# Patient Record
Sex: Male | Born: 1955 | Race: Asian | Hispanic: No | Marital: Married | Smoking: Current every day smoker
Health system: Southern US, Community
[De-identification: ages and names within clinical notes are randomized; demographics above are authoritative.]

## PROBLEM LIST (undated history)

## (undated) DIAGNOSIS — Z87448 Personal history of other diseases of urinary system: Secondary | ICD-10-CM

## (undated) DIAGNOSIS — E785 Hyperlipidemia, unspecified: Secondary | ICD-10-CM

## (undated) DIAGNOSIS — F17200 Nicotine dependence, unspecified, uncomplicated: Secondary | ICD-10-CM

## (undated) DIAGNOSIS — I2510 Atherosclerotic heart disease of native coronary artery without angina pectoris: Secondary | ICD-10-CM

## (undated) HISTORY — DX: Personal history of other diseases of urinary system: Z87.448

## (undated) HISTORY — DX: Hyperlipidemia, unspecified: E78.5

## (undated) HISTORY — DX: Nicotine dependence, unspecified, uncomplicated: F17.200

---
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
## 2005-04-10 ENCOUNTER — Ambulatory Visit: Payer: Self-pay | Admitting: Family Medicine

## 2005-06-05 ENCOUNTER — Ambulatory Visit: Payer: Self-pay | Admitting: Family Medicine

## 2005-06-12 ENCOUNTER — Ambulatory Visit: Payer: Self-pay | Admitting: Family Medicine

## 2008-04-11 ENCOUNTER — Ambulatory Visit: Payer: Self-pay | Admitting: Family Medicine

## 2008-04-11 DIAGNOSIS — J1189 Influenza due to unidentified influenza virus with other manifestations: Secondary | ICD-10-CM

## 2008-05-13 ENCOUNTER — Ambulatory Visit: Payer: Self-pay | Admitting: Family Medicine

## 2008-05-13 LAB — CONVERTED CEMR LAB
Nitrite: NEGATIVE
Protein, U semiquant: NEGATIVE
Urobilinogen, UA: 0.2
WBC Urine, dipstick: NEGATIVE

## 2008-05-18 LAB — CONVERTED CEMR LAB
ALT: 30 units/L (ref 0–53)
AST: 23 units/L (ref 0–37)
Basophils Absolute: 0.1 10*3/uL (ref 0.0–0.1)
Basophils Relative: 0.9 % (ref 0.0–3.0)
Bilirubin, Direct: 0.1 mg/dL (ref 0.0–0.3)
CO2: 32 meq/L (ref 19–32)
Chloride: 107 meq/L (ref 96–112)
Cholesterol: 200 mg/dL (ref 0–200)
Creatinine, Ser: 1.1 mg/dL (ref 0.4–1.5)
LDL Cholesterol: 142 mg/dL — ABNORMAL HIGH (ref 0–99)
Lymphocytes Relative: 30 % (ref 12.0–46.0)
MCHC: 34.1 g/dL (ref 30.0–36.0)
Monocytes Relative: 10.4 % (ref 3.0–12.0)
Neutrophils Relative %: 52.3 % (ref 43.0–77.0)
RBC: 5.11 M/uL (ref 4.22–5.81)
Sodium: 144 meq/L (ref 135–145)
Total Bilirubin: 1.2 mg/dL (ref 0.3–1.2)
Total CHOL/HDL Ratio: 5.4
VLDL: 21 mg/dL (ref 0–40)

## 2008-05-24 ENCOUNTER — Ambulatory Visit: Payer: Self-pay | Admitting: Family Medicine

## 2009-10-03 ENCOUNTER — Ambulatory Visit: Payer: Self-pay | Admitting: Family Medicine

## 2009-10-03 DIAGNOSIS — N390 Urinary tract infection, site not specified: Secondary | ICD-10-CM | POA: Insufficient documentation

## 2009-10-03 LAB — CONVERTED CEMR LAB
Ketones, urine, test strip: NEGATIVE
Nitrite: NEGATIVE
Urobilinogen, UA: 0.2

## 2009-11-09 ENCOUNTER — Encounter: Payer: Self-pay | Admitting: Family Medicine

## 2009-11-21 ENCOUNTER — Ambulatory Visit: Payer: Self-pay | Admitting: Family Medicine

## 2009-11-21 LAB — CONVERTED CEMR LAB
Bilirubin Urine: NEGATIVE
Ketones, urine, test strip: NEGATIVE
Urobilinogen, UA: 0.2

## 2009-11-22 ENCOUNTER — Telehealth: Payer: Self-pay | Admitting: Family Medicine

## 2009-11-22 LAB — CONVERTED CEMR LAB
Alkaline Phosphatase: 68 units/L (ref 39–117)
Basophils Absolute: 0.1 10*3/uL (ref 0.0–0.1)
Basophils Relative: 1.4 % (ref 0.0–3.0)
Bilirubin, Direct: 0.1 mg/dL (ref 0.0–0.3)
CO2: 29 meq/L (ref 19–32)
Calcium: 9.4 mg/dL (ref 8.4–10.5)
Cholesterol: 207 mg/dL — ABNORMAL HIGH (ref 0–200)
Creatinine, Ser: 1 mg/dL (ref 0.4–1.5)
Direct LDL: 158.9 mg/dL
Eosinophils Absolute: 0.4 10*3/uL (ref 0.0–0.7)
Lymphocytes Relative: 27 % (ref 12.0–46.0)
MCHC: 33.8 g/dL (ref 30.0–36.0)
Neutrophils Relative %: 56.9 % (ref 43.0–77.0)
Platelets: 227 10*3/uL (ref 150.0–400.0)
RBC: 4.8 M/uL (ref 4.22–5.81)
Total Bilirubin: 1.1 mg/dL (ref 0.3–1.2)
Total CHOL/HDL Ratio: 4
Total Protein: 6.8 g/dL (ref 6.0–8.3)
Triglycerides: 97 mg/dL (ref 0.0–149.0)
VLDL: 19.4 mg/dL (ref 0.0–40.0)
WBC: 6.8 10*3/uL (ref 4.5–10.5)

## 2009-11-28 ENCOUNTER — Ambulatory Visit: Payer: Self-pay | Admitting: Family Medicine

## 2009-12-27 ENCOUNTER — Encounter: Payer: Self-pay | Admitting: Family Medicine

## 2009-12-27 LAB — HM COLONOSCOPY

## 2010-07-01 HISTORY — PX: COLONOSCOPY: SHX174

## 2010-07-31 NOTE — Assessment & Plan Note (Signed)
Summary: CPX/NJR   Vital Signs:  Patient profile:   55 year old male Weight:      121 pounds BMI:     20.84 BP sitting:   120 / 84  (left arm) Cuff size:   regular  Vitals Entered By: Raechel Ache, RN (Nov 28, 2009 9:20 AM) CC: CPX, labs done.   History of Present Illness: 55 yr old male for a cpx. He feels fine and has no concerns. He will have a colonoscopy next month per Dr. Elsie Amis.   Allergies (verified): No Known Drug Allergies  Past History:  Past Medical History: Reviewed history from 10/03/2009 and no changes required. pyelonephritis once  Past Surgical History: Reviewed history from 04/11/2008 and no changes required. Denies surgical history  Family History: Reviewed history from 04/11/2008 and no changes required. Family History Hypertension Brain Tumor-father  Social History: Reviewed history from 05/24/2008 and no changes required. Married Current Smoker 1/2 ppd Alcohol use-no Drug use-no  Review of Systems  The patient denies anorexia, fever, weight loss, weight gain, vision loss, decreased hearing, hoarseness, chest pain, syncope, dyspnea on exertion, peripheral edema, prolonged cough, headaches, hemoptysis, abdominal pain, melena, hematochezia, severe indigestion/heartburn, hematuria, incontinence, genital sores, muscle weakness, suspicious skin lesions, transient blindness, difficulty walking, depression, unusual weight change, abnormal bleeding, enlarged lymph nodes, angioedema, breast masses, and testicular masses.    Physical Exam  General:  Well-developed,well-nourished,in no acute distress; alert,appropriate and cooperative throughout examination Head:  Normocephalic and atraumatic without obvious abnormalities. No apparent alopecia or balding. Eyes:  No corneal or conjunctival inflammation noted. EOMI. Perrla. Funduscopic exam benign, without hemorrhages, exudates or papilledema. Vision grossly normal. Ears:  External ear exam shows no  significant lesions or deformities.  Otoscopic examination reveals clear canals, tympanic membranes are intact bilaterally without bulging, retraction, inflammation or discharge. Hearing is grossly normal bilaterally. Nose:  External nasal examination shows no deformity or inflammation. Nasal mucosa are pink and moist without lesions or exudates. Mouth:  Oral mucosa and oropharynx without lesions or exudates.  Teeth in good repair. Neck:  No deformities, masses, or tenderness noted. Chest Wall:  No deformities, masses, tenderness or gynecomastia noted. Lungs:  Normal respiratory effort, chest expands symmetrically. Lungs are clear to auscultation, no crackles or wheezes. Heart:  Normal rate and regular rhythm. S1 and S2 normal without gallop, murmur, click, rub or other extra sounds. EKG normal Abdomen:  Bowel sounds positive,abdomen soft and non-tender without masses, organomegaly or hernias noted. Rectal:  No external abnormalities noted. Normal sphincter tone. No rectal masses or tenderness. Heme neg. Genitalia:  Testes bilaterally descended without nodularity, tenderness or masses. No scrotal masses or lesions. No penis lesions or urethral discharge. Prostate:  Prostate gland firm and smooth, no enlargement, nodularity, tenderness, mass, asymmetry or induration. Msk:  No deformity or scoliosis noted of thoracic or lumbar spine.   Pulses:  R and L carotid,radial,femoral,dorsalis pedis and posterior tibial pulses are full and equal bilaterally Extremities:  No clubbing, cyanosis, edema, or deformity noted with normal full range of motion of all joints.   Neurologic:  No cranial nerve deficits noted. Station and gait are normal. Plantar reflexes are down-going bilaterally. DTRs are symmetrical throughout. Sensory, motor and coordinative functions appear intact. Skin:  Intact without suspicious lesions or rashes Cervical Nodes:  No lymphadenopathy noted Axillary Nodes:  No palpable  lymphadenopathy Inguinal Nodes:  No significant adenopathy Psych:  Cognition and judgment appear intact. Alert and cooperative with normal attention span and concentration. No apparent delusions,  illusions, hallucinations   Impression & Recommendations:  Problem # 1:  WELL ADULT EXAM (ICD-V70.0)  Orders: Hemoccult Guaiac-1 spec.(in office) (82270) EKG w/ Interpretation (93000)  Patient Instructions: 1)  Please schedule a follow-up appointment in 1 year.

## 2010-07-31 NOTE — Consult Note (Signed)
Summary: [facility]  [facility]   Imported By: Maryln Gottron 11/28/2009 14:23:56  _____________________________________________________________________  External Attachment:    Type:   Image     Comment:   External Document

## 2010-07-31 NOTE — Procedures (Signed)
Summary: Colonoscopy Report/[facility]  Colonoscopy Report/[facility]   Imported By: Maryln Gottron 01/02/2010 13:50:56  _____________________________________________________________________  External Attachment:    Type:   Image     Comment:   External Document

## 2010-07-31 NOTE — Assessment & Plan Note (Signed)
Summary: back pain//ccm   Vital Signs:  Patient profile:   56 year old male Weight:      126 pounds Temp:     98.0 degrees F oral BP sitting:   116 / 80  (left arm) Cuff size:   regular  Vitals Entered By: Duard Brady LPN (October 03, 1608 3:36 PM) CC: c/o low back pain (R) side moving to upper abd  Is Patient Diabetic? No   History of Present Illness: Here for the onset last night of dull intermittent pains which started in the RLQ of his abdomen, which then moved to the right flank and right middle back early this am. They have eased off quite a bit today, and now he can barely feel it. His BMs are regular, and he has one most mornings. he had a BM this am as well. No urinary symptoms, no fever, no nausea. His appetite is normal. he is very concerned that this may be an early kidney infection, since he had a bad kidney infection many years ago that put him in the hospital.   Preventive Screening-Counseling & Management  Alcohol-Tobacco     Smoking Status: current  Allergies (verified): No Known Drug Allergies  Past History:  Past Medical History: pyelonephritis once  Past Surgical History: Reviewed history from 04/11/2008 and no changes required. Denies surgical history  Review of Systems  The patient denies anorexia, fever, weight loss, weight gain, vision loss, decreased hearing, hoarseness, chest pain, syncope, dyspnea on exertion, peripheral edema, prolonged cough, headaches, hemoptysis, melena, hematochezia, severe indigestion/heartburn, hematuria, incontinence, genital sores, muscle weakness, suspicious skin lesions, transient blindness, difficulty walking, depression, unusual weight change, abnormal bleeding, enlarged lymph nodes, angioedema, breast masses, and testicular masses.    Physical Exam  General:  Well-developed,well-nourished,in no acute distress; alert,appropriate and cooperative throughout examination Lungs:  Normal respiratory effort, chest  expands symmetrically. Lungs are clear to auscultation, no crackles or wheezes. Heart:  Normal rate and regular rhythm. S1 and S2 normal without gallop, murmur, click, rub or other extra sounds. Abdomen:  Bowel sounds positive,abdomen soft and non-tender without masses, organomegaly or hernias noted. No CVAT.    Impression & Recommendations:  Problem # 1:  UTI (ICD-599.0)  His updated medication list for this problem includes:    Cipro 500 Mg Tabs (Ciprofloxacin hcl) .Marland Kitchen..Marland Kitchen Two times a day  Orders: UA Dipstick w/o Micro (manual) (96045) T-Culture, Urine ([id number])  Complete Medication List: 1)  Cipro 500 Mg Tabs (Ciprofloxacin hcl) .... Two times a day  Patient Instructions: 1)  The hematuria is most likely due to an early infection, so we will start Cipro. Await culture results. Drink plenty of water.  Prescriptions: CIPRO 500 MG TABS (CIPROFLOXACIN HCL) two times a day  #20 x 0   Entered and Authorized by:   Nelwyn Salisbury MD   Signed by:   Nelwyn Salisbury MD on 10/03/2009   Method used:   Electronically to        [facility]. [phone]* (retail)       [phone]       [place], [place]  [address]       Ph: [phone]       Fax: [phone]   RxID:   [id number]   Laboratory Results   Urine Tests  Date/Time Received: October 03, 2009 4:48 PM  Date/Time Reported: October 03, 2009 4:48 PM   Routine Urinalysis  Color: yellow Appearance: Clear Glucose: negative   (Normal Range: Negative) Bilirubin: negative   (Normal Range: Negative) Ketone: negative   (Normal Range: Negative) Spec. Gravity: <1.005   (Normal Range: 1.003-1.035) Blood: moderate   (Normal Range: Negative) pH: 5.0   (Normal Range: 5.0-8.0) Protein: trace   (Normal Range: Negative) Urobilinogen: 0.2   (Normal Range: 0-1) Nitrite: negative   (Normal Range: Negative) Leukocyte Esterace: negative   (Normal Range: Negative)       Appended Document: back  pain//ccm ua results on chart.  order in process

## 2010-07-31 NOTE — Progress Notes (Signed)
Summary: please return call  Phone Note Call from Patient Call back at [phone]   Caller: Patient---live call Summary of Call: Returning Judy's call. Initial call taken by: Warnell Forester,  Nov 22, 2009 9:37 AM  Follow-up for Phone Call        Phone Call Completed Follow-up by: Raechel Ache, RN,  Nov 22, 2009 9:42 AM

## 2013-06-03 ENCOUNTER — Telehealth: Payer: Self-pay | Admitting: Family Medicine

## 2013-06-03 NOTE — Telephone Encounter (Signed)
Pt would like to re-est . Can I sch? °

## 2013-06-04 NOTE — Telephone Encounter (Signed)
Sorry but he will need to see someone else

## 2013-06-07 NOTE — Telephone Encounter (Signed)
lmom for pt to call back

## 2013-06-11 NOTE — Telephone Encounter (Signed)
Pt wife is aware

## 2013-06-11 NOTE — Telephone Encounter (Signed)
Pt is sch with dr Artist Pais

## 2013-08-02 ENCOUNTER — Encounter: Payer: Self-pay | Admitting: Internal Medicine

## 2013-08-02 ENCOUNTER — Ambulatory Visit (INDEPENDENT_AMBULATORY_CARE_PROVIDER_SITE_OTHER): Payer: Managed Care, Other (non HMO) | Admitting: Internal Medicine

## 2013-08-02 ENCOUNTER — Encounter (INDEPENDENT_AMBULATORY_CARE_PROVIDER_SITE_OTHER): Payer: Self-pay

## 2013-08-02 VITALS — BP 114/80 | HR 68 | Temp 97.9°F | Ht 64.25 in | Wt 123.0 lb

## 2013-08-02 DIAGNOSIS — F17200 Nicotine dependence, unspecified, uncomplicated: Secondary | ICD-10-CM

## 2013-08-02 DIAGNOSIS — Z0000 Encounter for general adult medical examination without abnormal findings: Secondary | ICD-10-CM | POA: Insufficient documentation

## 2013-08-02 LAB — CBC WITH DIFFERENTIAL/PLATELET
Basophils Absolute: 0.1 10*3/uL (ref 0.0–0.1)
Basophils Relative: 1.4 % (ref 0.0–3.0)
Eosinophils Absolute: 0.4 10*3/uL (ref 0.0–0.7)
Eosinophils Relative: 6 % — ABNORMAL HIGH (ref 0.0–5.0)
HEMATOCRIT: 45.8 % (ref 39.0–52.0)
Hemoglobin: 14.8 g/dL (ref 13.0–17.0)
LYMPHS ABS: 2.1 10*3/uL (ref 0.7–4.0)
Lymphocytes Relative: 30.3 % (ref 12.0–46.0)
MCHC: 32.3 g/dL (ref 30.0–36.0)
MCV: 92 fl (ref 78.0–100.0)
MONO ABS: 0.7 10*3/uL (ref 0.1–1.0)
MONOS PCT: 9.7 % (ref 3.0–12.0)
Neutro Abs: 3.7 10*3/uL (ref 1.4–7.7)
Neutrophils Relative %: 52.6 % (ref 43.0–77.0)
PLATELETS: 238 10*3/uL (ref 150.0–400.0)
RBC: 4.98 Mil/uL (ref 4.22–5.81)
RDW: 13.2 % (ref 11.5–14.6)
WBC: 7 10*3/uL (ref 4.5–10.5)

## 2013-08-02 LAB — BASIC METABOLIC PANEL
BUN: 18 mg/dL (ref 6–23)
CALCIUM: 9.6 mg/dL (ref 8.4–10.5)
CO2: 28 mEq/L (ref 19–32)
CREATININE: 1.3 mg/dL (ref 0.4–1.5)
Chloride: 102 mEq/L (ref 96–112)
GFR: 63.42 mL/min (ref >60.00)
GLUCOSE: 76 mg/dL (ref 70–99)
Potassium: 4 mEq/L (ref 3.5–5.1)
Sodium: 139 mEq/L (ref 135–145)

## 2013-08-02 LAB — HEPATIC FUNCTION PANEL
ALT: 30 U/L (ref 0–53)
AST: 26 U/L (ref 0–37)
Albumin: 4.5 g/dL (ref 3.5–5.2)
Alkaline Phosphatase: 73 U/L (ref 39–117)
BILIRUBIN TOTAL: 1.1 mg/dL (ref 0.3–1.2)
Bilirubin, Direct: 0.1 mg/dL (ref 0.0–0.3)
Total Protein: 7.7 g/dL (ref 6.0–8.3)

## 2013-08-02 LAB — LIPID PANEL
CHOL/HDL RATIO: 4
CHOLESTEROL: 217 mg/dL — AB (ref 0–200)
HDL: 51.5 mg/dL (ref >39.00)
TRIGLYCERIDES: 92 mg/dL (ref 0.0–149.0)
VLDL: 18.4 mg/dL (ref 0.0–40.0)

## 2013-08-02 LAB — TSH: TSH: 0.56 u[IU]/mL (ref 0.35–5.50)

## 2013-08-02 NOTE — Patient Instructions (Signed)
It is very important that you stop smoking.  If you are unable to quit within 6 months, I suggest you make a follow up appointment. Please sign on to MyChart to view your test results.

## 2013-08-02 NOTE — Assessment & Plan Note (Addendum)
Reviewed adult health maintenance protocols.  Obtain screening blood work.  I strongly encouraged tobacco cessation. Patient advised to try over-the-counter nicotine replacement supplement.  If patient unable to quit, consider trial of Chantix. Patient had screening colonoscopy in 2011. It was reported completely normal. His next colonoscopy will be due in 2021.

## 2013-08-02 NOTE — Progress Notes (Addendum)
Subjective:    Patient ID: Kurt PalmsRicardo Tripodi, male    DOB: Jun 22, 1956, 58 y.o.   MRN: [id number]  HPI  58 year old Asian male to establish and for routine physical. He was last seen by Dr. Clent RidgesFry in 2011. He has long history of tobacco use. He has smoked half pack per day for the last 46 years. He has tried and failed in quitting smoking in the past. He denies chronic cough or shortness of breath but has mild dyspnea with exertion.  He denies any history of chronic illnesses. He does not take any medications. He reports history of mildly elevated cholesterol. He reports undergoing colonoscopy in 2011. Afterwards, he noticed a "pulling sensation" near area left of his rectum. His symptoms mainly occur while standing with certain positions. It lasted 4-5 months then completely resolved on its own 2 months ago. He has tried to trigger symptoms but unable.  He denies any changes in bowel habits. He denies constipation or diarrhea. He does not have any genitourinary symptoms.   Review of Systems  Constitutional: Negative for activity change, appetite change and unexpected weight change.  Eyes: Negative for visual disturbance.  Respiratory: Negative for cough, chest tightness and shortness of breath.  mild dyspnea with exertion Cardiovascular: Negative for chest pain.  Genitourinary: Negative for difficulty urinating.  Neurological: Negative for headaches.  Gastrointestinal: Negative for abdominal pain, heartburn melena or hematochezia Psych: Negative for depression or anxiety Endo:  No polyuria or polydypsia, Mild erectile dysfunction     Past Medical History  Diagnosis Date  . History of pyelonephritis     History   Social History  . Marital Status: Married    Spouse Name: N/A    Number of Children: 3  . Years of Education: N/A   Occupational History  . ComptrollerMechanical engineer    Social History Main Topics  . Smoking status: Current Every Day Smoker -- 0.50 packs/day for 46 years   Types: Cigarettes  . Smokeless tobacco: Not on file  . Alcohol Use: No  . Drug Use: No  . Sexual Activity: Not on file   Other Topics Concern  . Not on file   Social History Narrative   Married 30 years   3 daughters ages 2926, 5923, 3315    Past Surgical History  Procedure Laterality Date  . Colonoscopy  2012    Normal    Family History  Problem Relation Age of Onset  . Cancer Father 45    brain  . Hypertension Mother     No Known Allergies  No current outpatient prescriptions on file prior to visit.   No current facility-administered medications on file prior to visit.    BP 114/80  Pulse 68  Temp(Src) 97.9 F (36.6 C) (Oral)  Ht 5' 4.25" (1.632 m)  Wt 123 lb (55.792 kg)  BMI 20.95 kg/m2            Objective:   Physical Exam  Constitutional: He is oriented to person, place, and time. He appears well-developed and well-nourished. No distress.  HENT:  Head: Normocephalic and atraumatic.  Right Ear: External ear normal.  Left Ear: External ear normal.  Mouth/Throat: Oropharynx is clear and moist.  Eyes: EOM are normal. Pupils are equal, round, and reactive to light.  Neck: Neck supple.  No carotid bruit  Cardiovascular: Normal rate, regular rhythm and normal heart sounds.   No murmur heard. Pulmonary/Chest: Effort normal and breath sounds normal. He has no wheezes.  Abdominal: Soft. Bowel  sounds are normal. He exhibits no mass. There is no tenderness.  No inguinal hernias  Genitourinary: Penis normal. Guaiac negative stool.  External hemorrhoids  Musculoskeletal: He exhibits no edema.  Lymphadenopathy:    He has no cervical adenopathy.  Neurological: He is alert and oriented to person, place, and time. No cranial nerve deficit.  Skin: Skin is warm and dry.  Psychiatric: He has a normal mood and affect. His behavior is normal.          Assessment & Plan:

## 2013-08-02 NOTE — Progress Notes (Signed)
Pre visit review using our clinic review tool, if applicable. No additional management support is needed unless otherwise documented below in the visit note. 

## 2013-08-03 LAB — LDL CHOLESTEROL, DIRECT: Direct LDL: 151.3 mg/dL

## 2013-08-04 ENCOUNTER — Telehealth: Payer: Self-pay | Admitting: Internal Medicine

## 2013-08-04 NOTE — Telephone Encounter (Signed)
Relevant patient education mailed to patient.  

## 2013-08-05 ENCOUNTER — Other Ambulatory Visit: Payer: Self-pay | Admitting: *Deleted

## 2013-08-05 MED ORDER — ATORVASTATIN CALCIUM 20 MG PO TABS: 20.0000 mg | ORAL_TABLET | Freq: Every day | ORAL | Status: DC

## 2013-11-02 ENCOUNTER — Other Ambulatory Visit (INDEPENDENT_AMBULATORY_CARE_PROVIDER_SITE_OTHER): Payer: Managed Care, Other (non HMO)

## 2013-11-02 DIAGNOSIS — Z125 Encounter for screening for malignant neoplasm of prostate: Secondary | ICD-10-CM

## 2013-11-02 DIAGNOSIS — E785 Hyperlipidemia, unspecified: Secondary | ICD-10-CM

## 2013-11-02 LAB — HEPATIC FUNCTION PANEL
ALT: 32 U/L (ref 0–53)
AST: 24 U/L (ref 0–37)
Albumin: 4.2 g/dL (ref 3.5–5.2)
Alkaline Phosphatase: 66 U/L (ref 39–117)
BILIRUBIN DIRECT: 0.2 mg/dL (ref 0.0–0.3)
Total Bilirubin: 0.6 mg/dL (ref 0.2–1.2)
Total Protein: 6.8 g/dL (ref 6.0–8.3)

## 2013-11-02 LAB — PSA: PSA: 1 ng/mL (ref 0.10–4.00)

## 2013-11-02 LAB — LIPID PANEL
CHOL/HDL RATIO: 3
Cholesterol: 140 mg/dL (ref 0–200)
HDL: 46.7 mg/dL (ref >39.00)
LDL CALC: 79 mg/dL (ref 0–99)
Triglycerides: 72 mg/dL (ref 0.0–149.0)
VLDL: 14.4 mg/dL (ref 0.0–40.0)

## 2013-11-10 ENCOUNTER — Encounter: Payer: Self-pay | Admitting: Internal Medicine

## 2013-11-10 ENCOUNTER — Ambulatory Visit (INDEPENDENT_AMBULATORY_CARE_PROVIDER_SITE_OTHER): Payer: Managed Care, Other (non HMO) | Admitting: Internal Medicine

## 2013-11-10 VITALS — BP 114/82 | HR 76 | Temp 98.3°F | Ht 64.25 in | Wt 122.0 lb

## 2013-11-10 DIAGNOSIS — E785 Hyperlipidemia, unspecified: Secondary | ICD-10-CM

## 2013-11-10 DIAGNOSIS — F17200 Nicotine dependence, unspecified, uncomplicated: Secondary | ICD-10-CM

## 2013-11-10 MED ORDER — ATORVASTATIN CALCIUM 20 MG PO TABS: 20.0000 mg | ORAL_TABLET | Freq: Every day | ORAL | Status: DC

## 2013-11-10 NOTE — Assessment & Plan Note (Signed)
Previous lipid panel shows elevated LDL in 58 year old Asian male smoker. His ten-year cardiovascular risk is 9.7%. Patient started on Atorvastatin 20 mg once daily. He is tolerating without side effects. Continue same dose.  Lab Results  Component Value Date   CHOL 140 11/02/2013   CHOL 217* 08/02/2013   CHOL 207* 11/21/2009   Lab Results  Component Value Date   HDL 46.70 11/02/2013   HDL 51.50 08/02/2013   HDL 47.00 11/21/2009   Lab Results  Component Value Date   LDLCALC 79 11/02/2013   LDLCALC 142* 05/13/2008   Lab Results  Component Value Date   TRIG 72.0 11/02/2013   TRIG 92.0 08/02/2013   TRIG 97.0 11/21/2009   Lab Results  Component Value Date   CHOLHDL 3 11/02/2013   CHOLHDL 4 08/02/2013   CHOLHDL 4 11/21/2009   Lab Results  Component Value Date   LDLDIRECT 151.3 08/02/2013   LDLDIRECT 158.9 11/21/2009   Lab Results  Component Value Date   ALT 32 11/02/2013   AST 24 11/02/2013   ALKPHOS 66 11/02/2013   BILITOT 0.6 11/02/2013

## 2013-11-10 NOTE — Progress Notes (Signed)
Pre visit review using our clinic review tool, if applicable. No additional management support is needed unless otherwise documented below in the visit note. 

## 2013-11-10 NOTE — Progress Notes (Signed)
   Subjective:    Patient ID: Kurt Duncan, male    DOB: 15-Mar-1956, 58 y.o.   MRN: [id number]  HPI  58 year old Asian male with a history of tobacco use for followup regarding hyperlipidemia. At previous visit we obtained fasting lipid panel. His LDL is elevated. Patient started on atorvastatin 20 mg once daily. He is tolerating medication without any side effects. His follow up lipid panel shows nice reduction and LDL (less than 100). His LFTs are stable.  Patient reports decreasing tobacco use. He is smoking half pack every 3 days.  He has been under more stress than usual recently. A good friend of similar age recently died of heart attack.  Review of Systems Negative for myalgia or weakness    Past Medical History  Diagnosis Date  . History of pyelonephritis     History   Social History  . Marital Status: Married    Spouse Name: N/A    Number of Children: 3  . Years of Education: N/A   Occupational History  . ComptrollerMechanical engineer    Social History Main Topics  . Smoking status: Current Every Day Smoker -- 0.50 packs/day for 46 years    Types: Cigarettes  . Smokeless tobacco: Not on file  . Alcohol Use: No  . Drug Use: No  . Sexual Activity: Not on file   Other Topics Concern  . Not on file   Social History Narrative   Married 30 years   3 daughters ages 3426, 723, 6115    Past Surgical History  Procedure Laterality Date  . Colonoscopy  2012    Normal    Family History  Problem Relation Age of Onset  . Cancer Father 45    brain  . Hypertension Mother     No Known Allergies  No current outpatient prescriptions on file prior to visit.   No current facility-administered medications on file prior to visit.    BP 114/82  Pulse 76  Temp(Src) 98.3 F (36.8 C) (Oral)  Ht 5' 4.25" (1.632 m)  Wt 122 lb (55.339 kg)  BMI 20.78 kg/m2    Objective:   Physical Exam  Constitutional: He is oriented to person, place, and time. He appears well-developed and  well-nourished.  Cardiovascular: Normal rate, regular rhythm and normal heart sounds.   No murmur heard. Pulmonary/Chest: Effort normal and breath sounds normal. He has no wheezes.  Neurological: He is alert and oriented to person, place, and time. No cranial nerve deficit.  Psychiatric: He has a normal mood and affect. His behavior is normal.       Assessment & Plan:

## 2013-11-10 NOTE — Assessment & Plan Note (Signed)
Strongly urged tobacco cessation.   

## 2013-11-10 NOTE — Patient Instructions (Signed)
Please complete the following lab tests before your next follow up appointment: CPX labs before office visit

## 2013-11-11 ENCOUNTER — Telehealth: Payer: Self-pay | Admitting: Internal Medicine

## 2013-11-11 NOTE — Telephone Encounter (Signed)
Relevant patient education assigned to patient using Emmi. ° °

## 2014-08-03 ENCOUNTER — Other Ambulatory Visit (INDEPENDENT_AMBULATORY_CARE_PROVIDER_SITE_OTHER): Payer: Managed Care, Other (non HMO)

## 2014-08-03 DIAGNOSIS — Z0000 Encounter for general adult medical examination without abnormal findings: Secondary | ICD-10-CM

## 2014-08-03 LAB — POCT URINALYSIS DIP (MANUAL ENTRY)
Bilirubin, UA: NEGATIVE
Glucose, UA: NEGATIVE
Ketones, POC UA: NEGATIVE
LEUKOCYTES UA: NEGATIVE
Nitrite, UA: NEGATIVE
Protein Ur, POC: NEGATIVE
RBC UA: NEGATIVE
Spec Grav, UA: 1.015
Urobilinogen, UA: 0.2
pH, UA: 5.5

## 2014-08-03 LAB — CBC WITH DIFFERENTIAL/PLATELET
Basophils Absolute: 0.1 10*3/uL (ref 0.0–0.1)
Basophils Relative: 0.7 % (ref 0.0–3.0)
EOS ABS: 0.6 10*3/uL (ref 0.0–0.7)
EOS PCT: 6.8 % — AB (ref 0.0–5.0)
HCT: 45.8 % (ref 39.0–52.0)
Hemoglobin: 15.3 g/dL (ref 13.0–17.0)
Lymphocytes Relative: 29.4 % (ref 12.0–46.0)
Lymphs Abs: 2.5 10*3/uL (ref 0.7–4.0)
MCHC: 33.5 g/dL (ref 30.0–36.0)
MCV: 89.2 fl (ref 78.0–100.0)
MONO ABS: 0.7 10*3/uL (ref 0.1–1.0)
Monocytes Relative: 8.7 % (ref 3.0–12.0)
NEUTROS PCT: 54.4 % (ref 43.0–77.0)
Neutro Abs: 4.7 10*3/uL (ref 1.4–7.7)
Platelets: 218 10*3/uL (ref 150.0–400.0)
RBC: 5.13 Mil/uL (ref 4.22–5.81)
RDW: 13.5 % (ref 11.5–15.5)
WBC: 8.6 10*3/uL (ref 4.0–10.5)

## 2014-08-03 LAB — BASIC METABOLIC PANEL
BUN: 17 mg/dL (ref 6–23)
CHLORIDE: 105 meq/L (ref 96–112)
CO2: 30 meq/L (ref 19–32)
Calcium: 9.3 mg/dL (ref 8.4–10.5)
Creatinine, Ser: 1.29 mg/dL (ref 0.40–1.50)
GFR: 60.94 mL/min (ref >60.00)
Glucose, Bld: 82 mg/dL (ref 70–99)
Potassium: 4 mEq/L (ref 3.5–5.1)
Sodium: 141 mEq/L (ref 135–145)

## 2014-08-03 LAB — HEPATIC FUNCTION PANEL
ALBUMIN: 4.3 g/dL (ref 3.5–5.2)
ALT: 28 U/L (ref 0–53)
AST: 19 U/L (ref 0–37)
Alkaline Phosphatase: 71 U/L (ref 39–117)
Bilirubin, Direct: 0.2 mg/dL (ref 0.0–0.3)
TOTAL PROTEIN: 6.6 g/dL (ref 6.0–8.3)
Total Bilirubin: 0.8 mg/dL (ref 0.2–1.2)

## 2014-08-03 LAB — LIPID PANEL
CHOL/HDL RATIO: 3
CHOLESTEROL: 133 mg/dL (ref 0–200)
HDL: 47.1 mg/dL (ref >39.00)
LDL Cholesterol: 66 mg/dL (ref 0–99)
NonHDL: 85.9
TRIGLYCERIDES: 99 mg/dL (ref 0.0–149.0)
VLDL: 19.8 mg/dL (ref 0.0–40.0)

## 2014-08-03 LAB — TSH: TSH: 1.15 u[IU]/mL (ref 0.35–4.50)

## 2014-08-03 LAB — PSA: PSA: 0.77 ng/mL (ref 0.10–4.00)

## 2014-08-10 ENCOUNTER — Encounter: Payer: Self-pay | Admitting: Internal Medicine

## 2014-08-10 ENCOUNTER — Ambulatory Visit (INDEPENDENT_AMBULATORY_CARE_PROVIDER_SITE_OTHER): Payer: Managed Care, Other (non HMO) | Admitting: Internal Medicine

## 2014-08-10 VITALS — BP 110/82 | Temp 98.6°F | Ht 63.5 in | Wt 122.0 lb

## 2014-08-10 DIAGNOSIS — Z0000 Encounter for general adult medical examination without abnormal findings: Secondary | ICD-10-CM

## 2014-08-10 DIAGNOSIS — Z23 Encounter for immunization: Secondary | ICD-10-CM

## 2014-08-10 MED ORDER — ATORVASTATIN CALCIUM 20 MG PO TABS: 20.0000 mg | ORAL_TABLET | Freq: Every day | ORAL | Status: DC

## 2014-08-10 NOTE — Assessment & Plan Note (Addendum)
Reviewed adult health maintenance protocols.  Patient updated with Tdap.  Complete smoking cessation strongly encouraged.  Continue Lipitor.  Patient reports colonoscopy completed by Dr. Loreta AveMann 2-3 years ago.  Prostate cancer pros and cons discussed. DRE and PSA normal.  Recheck in 2 years.

## 2014-08-10 NOTE — Progress Notes (Signed)
Pre visit review using our clinic review tool, if applicable. No additional management support is needed unless otherwise documented below in the visit note. 

## 2014-08-10 NOTE — Progress Notes (Signed)
   Subjective:    Patient ID: Kurt Duncan, male    DOB: Apr 15, 1956, 59 y.o.   MRN: [id number]  HPI  59 year old Asian male with history of hyperlipidemia for routine physical. Patient denies any significant interval medical history. Patient decreased his tobacco use but has not been able to completely quit. Patient smoking approximately 5 cigarettes per day.  Pertinent social and family history reviewed and updated.  Review of Systems  Constitutional: Negative for activity change, appetite change and unexpected weight change.  Eyes: Negative for visual disturbance.  Respiratory: Negative for cough, chest tightness and shortness of breath.   Cardiovascular: Negative for chest pain.  Genitourinary: Negative for difficulty urinating.  Neurological: Negative for headaches.  Gastrointestinal: Negative for abdominal pain, heartburn melena or hematochezia Psych: Negative for depression or anxiety        Past Medical History  Diagnosis Date  . History of pyelonephritis     History   Social History  . Marital Status: Married    Spouse Name: N/A  . Number of Children: 3  . Years of Education: N/A   Occupational History  . ComptrollerMechanical engineer    Social History Main Topics  . Smoking status: Current Every Day Smoker -- 0.50 packs/day for 46 years    Types: Cigarettes  . Smokeless tobacco: Not on file  . Alcohol Use: No  . Drug Use: No  . Sexual Activity: Not on file   Other Topics Concern  . Not on file   Social History Narrative   Married 30 years   3 daughters ages 4126, 6723, 4315    Past Surgical History  Procedure Laterality Date  . Colonoscopy  2012    Normal    Family History  Problem Relation Age of Onset  . Cancer Father 45    brain  . Hypertension Mother     No Known Allergies  No current outpatient prescriptions on file prior to visit.   No current facility-administered medications on file prior to visit.    BP 110/82 mmHg  Temp(Src) 98.6 F  (37 C) (Oral)  Ht 5' 3.5" (1.613 m)  Wt 122 lb (55.339 kg)  BMI 21.27 kg/m2    Objective:   Physical Exam  Constitutional: Appears well-developed and well-nourished. No distress.  Head: Normocephalic and atraumatic.  Ear:  Right and left ear normal.  TMs clear.  Hearing is grossly normal Mouth/Throat: Oropharynx is clear and moist.  Eyes: Conjunctivae are normal. Pupils are equal, round, and reactive to light.  Neck: Normal range of motion. Neck supple. No thyromegaly present. No carotid bruit Cardiovascular: Normal rate, regular rhythm and normal heart sounds.  Exam reveals no gallop and no friction rub.  No murmur heard. Pulmonary/Chest: Effort normal and breath sounds normal.  No wheezes. No rales.  Abdominal: Soft. Bowel sounds are normal. No mass. There is no tenderness.  GU: no prostate nodules or asymetry Neurological: Alert. No cranial nerve deficit.  Skin: Skin is warm and dry.  Psychiatric: Normal mood and affect. Behavior is normal.        Assessment & Plan:

## 2014-08-10 NOTE — Patient Instructions (Signed)
Please complete the following lab tests before your next follow up appointment: CPX labs Please stop smoking

## 2015-08-07 ENCOUNTER — Other Ambulatory Visit (INDEPENDENT_AMBULATORY_CARE_PROVIDER_SITE_OTHER): Payer: Managed Care, Other (non HMO)

## 2015-08-07 DIAGNOSIS — Z0000 Encounter for general adult medical examination without abnormal findings: Secondary | ICD-10-CM | POA: Diagnosis not present

## 2015-08-07 LAB — HEPATIC FUNCTION PANEL
ALK PHOS: 78 U/L (ref 39–117)
ALT: 32 U/L (ref 0–53)
AST: 23 U/L (ref 0–37)
Albumin: 4.3 g/dL (ref 3.5–5.2)
BILIRUBIN DIRECT: 0.2 mg/dL (ref 0.0–0.3)
TOTAL PROTEIN: 6.9 g/dL (ref 6.0–8.3)
Total Bilirubin: 0.9 mg/dL (ref 0.2–1.2)

## 2015-08-07 LAB — LIPID PANEL
CHOLESTEROL: 163 mg/dL (ref 0–200)
HDL: 52.1 mg/dL (ref >39.00)
LDL CALC: 86 mg/dL (ref 0–99)
NonHDL: 110.49
TRIGLYCERIDES: 120 mg/dL (ref 0.0–149.0)
Total CHOL/HDL Ratio: 3
VLDL: 24 mg/dL (ref 0.0–40.0)

## 2015-08-07 LAB — BASIC METABOLIC PANEL
BUN: 16 mg/dL (ref 6–23)
CALCIUM: 9.5 mg/dL (ref 8.4–10.5)
CO2: 29 meq/L (ref 19–32)
CREATININE: 1.39 mg/dL (ref 0.40–1.50)
Chloride: 104 mEq/L (ref 96–112)
GFR: 55.71 mL/min — AB (ref >60.00)
Glucose, Bld: 91 mg/dL (ref 70–99)
Potassium: 4 mEq/L (ref 3.5–5.1)
SODIUM: 142 meq/L (ref 135–145)

## 2015-08-07 LAB — CBC WITH DIFFERENTIAL/PLATELET
BASOS ABS: 0.1 10*3/uL (ref 0.0–0.1)
Basophils Relative: 1.2 % (ref 0.0–3.0)
EOS ABS: 0.6 10*3/uL (ref 0.0–0.7)
Eosinophils Relative: 7.7 % — ABNORMAL HIGH (ref 0.0–5.0)
HEMATOCRIT: 48 % (ref 39.0–52.0)
Hemoglobin: 15.5 g/dL (ref 13.0–17.0)
LYMPHS PCT: 32.3 % (ref 12.0–46.0)
Lymphs Abs: 2.4 10*3/uL (ref 0.7–4.0)
MCHC: 32.3 g/dL (ref 30.0–36.0)
MCV: 90.6 fl (ref 78.0–100.0)
Monocytes Absolute: 0.7 10*3/uL (ref 0.1–1.0)
Monocytes Relative: 9.7 % (ref 3.0–12.0)
NEUTROS ABS: 3.6 10*3/uL (ref 1.4–7.7)
NEUTROS PCT: 49.1 % (ref 43.0–77.0)
PLATELETS: 222 10*3/uL (ref 150.0–400.0)
RBC: 5.29 Mil/uL (ref 4.22–5.81)
RDW: 13.2 % (ref 11.5–15.5)
WBC: 7.4 10*3/uL (ref 4.0–10.5)

## 2015-08-07 LAB — POC URINALSYSI DIPSTICK (AUTOMATED)
Bilirubin, UA: NEGATIVE
GLUCOSE UA: NEGATIVE
Ketones, UA: NEGATIVE
Leukocytes, UA: NEGATIVE
NITRITE UA: NEGATIVE
Protein, UA: NEGATIVE
Spec Grav, UA: >=1.03
UROBILINOGEN UA: 0.2
pH, UA: 5.5

## 2015-08-07 LAB — PSA: PSA: 0.71 ng/mL (ref 0.10–4.00)

## 2015-08-07 LAB — TSH: TSH: 0.97 u[IU]/mL (ref 0.35–4.50)

## 2015-08-14 ENCOUNTER — Encounter: Payer: Managed Care, Other (non HMO) | Admitting: Internal Medicine

## 2015-08-24 ENCOUNTER — Encounter: Payer: Self-pay | Admitting: Family Medicine

## 2015-08-24 ENCOUNTER — Ambulatory Visit (INDEPENDENT_AMBULATORY_CARE_PROVIDER_SITE_OTHER): Payer: Managed Care, Other (non HMO) | Admitting: Family Medicine

## 2015-08-24 VITALS — BP 110/80 | HR 97 | Temp 98.2°F | Ht 63.58 in | Wt 119.3 lb

## 2015-08-24 DIAGNOSIS — Z0000 Encounter for general adult medical examination without abnormal findings: Secondary | ICD-10-CM

## 2015-08-24 DIAGNOSIS — F17200 Nicotine dependence, unspecified, uncomplicated: Secondary | ICD-10-CM | POA: Diagnosis not present

## 2015-08-24 DIAGNOSIS — E785 Hyperlipidemia, unspecified: Secondary | ICD-10-CM

## 2015-08-24 MED ORDER — ATORVASTATIN CALCIUM 20 MG PO TABS
20.0000 mg | ORAL_TABLET | Freq: Every day | ORAL | Status: DC
Start: 2015-08-24 — End: 2016-08-29

## 2015-08-24 NOTE — Progress Notes (Signed)
Subjective:    Patient ID: Kurt Duncan, male    DOB: [birth year], 60 y.o.   MRN: [id number]  HPI  Mr. Hartsell is a 60 year old male who presents today for a routine annual exam. I am seeing him in place of his PCP who is not available today. Currently, he states that he is trying to cut back on smoking and has decreased his use of cigarettes to 1 pack q 3-4 days. He also notes that he only smokes 1/2 of the cigarette and discards it.  He denies interest in pharmacological therapy for smoking cessation but states that he is trying to cut back on his own.  He has a long history of tobacco use over the past 48 years.  Currently, he has dental visits every 6 months and is followed by a periodontist every 6 months as well to address periodontal disease.  He follows a heart healthy diet and prefers vegetables to meat. He currently denies exercising on a regular basis but states that he does not use elevators and will park his car at a distance to increase the number of steps/day. Yearly eye exams are completed and he wears corrective lenses.  Lipitor has provided great benefit for his cholesterol levels and he denies any myalgias Preventive Colonoscopy in 2011 with follow up 2021. He denies any blood in stool, thin caliber stools, or genitourinary symptoms.   Review of Systems  Constitutional: Negative for fever, chills, fatigue and unexpected weight change.  HENT: Negative for ear pain, postnasal drip, rhinorrhea, sinus pressure and sneezing.   Respiratory: Negative for cough, chest tightness and shortness of breath.   Cardiovascular: Negative for chest pain, palpitations and leg swelling.  Gastrointestinal: Negative for nausea, abdominal pain, diarrhea, constipation, blood in stool and rectal pain.  Endocrine: Negative.   Genitourinary: Negative for dysuria, urgency, hematuria and flank pain.  Musculoskeletal: Negative for myalgias, back pain and arthralgias.  Skin: Negative for pallor,  rash and wound.  Allergic/Immunologic:       Environmental and seasonal allergies  Neurological: Negative for dizziness, light-headedness and headaches.  Psychiatric/Behavioral:       Denies depressed mood or behavioral changes   Past Medical History  Diagnosis Date  . History of pyelonephritis     Social History   Social History  . Marital Status: Married    Spouse Name: N/A  . Number of Children: 3  . Years of Education: N/A   Occupational History  . Comptroller    Social History Main Topics  . Smoking status: Current Every Day Smoker -- 0.25 packs/day for 48 years    Types: Cigarettes  . Smokeless tobacco: Not on file     Comment: Encouaged patient to quit. He is interested and he states that his smoking is a "pasttime"  . Alcohol Use: No  . Drug Use: No  . Sexual Activity: Not on file   Other Topics Concern  . 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 Colonoscopy  2012    Normal    Family History  Problem Relation Age of Onset  . Cancer Father 45    brain  . Hypertension Mother     No Known Allergies  Current Outpatient Prescriptions on File Prior to Visit  Medication Sig Dispense Refill  . atorvastatin (LIPITOR) 20 MG tablet Take 1 tablet (20 mg total) by mouth daily. 90 tablet 3   No current facility-administered medications on file prior to visit.    BP 110/80 mmHg  Pulse 97  Temp(Src) 98.2 F (36.8 C) (Oral)  Ht 5' 3.58" (1.615 m)  Wt 119 lb 4.8 oz (54.114 kg)  BMI 20.75 kg/m2  SpO2 98%       Objective:   Physical Exam  Constitutional: He is oriented to person, place, and time. He appears well-developed and well-nourished.  Eyes: Pupils are equal, round, and reactive to light.  Cardiovascular: Normal rate, regular  rhythm, normal heart sounds and intact distal pulses.  Exam reveals no gallop and no friction rub.   No murmur heard. Pulmonary/Chest: Effort normal and breath sounds normal. No respiratory distress. He has no wheezes. He has no rales.  Abdominal: Soft. Bowel sounds are normal. He exhibits no distension. There is no tenderness.  Lymphadenopathy:    He has no cervical adenopathy.  Neurological: He is alert and oriented to person, place, and time.  Skin: Skin is warm and dry.  Psychiatric: He has a normal mood and affect. His behavior is normal.       Assessment & Plan:  1. Routine general medical examination at a health care facility Reviewed lab results with patient that PCP ordered.  Lab results are stable and colonoscopy is UTD. He declines influenza vaccine and he received a Tdap on 08/10/14.  2. Tobacco use disorder Strongly encouraged patient to stop smoking. Advised patient that he can try OTC nicotine replacement supplements and if unable to quit, Chantix trial can be considered. Patient noted that his wife is encouraging him to stop smoking daily and this seems to be the greatest influence for him to stop smoking.  3. Hyperlipidemia Lipid levels have improved with atorvastatin use. Hepatic function tests are WNL and patient denies any adverse effects from statin therapy. Continue atorvastatin as prescribed by PCP.  Follow up with PCP as needed.

## 2015-08-24 NOTE — Patient Instructions (Signed)
Great work cutting back on your smoking! Please follow up in 1 year unless any concerns or issues arise. Heart-Healthy Eating Plan Many factors influence your heart health, including eating and exercise habits. Heart (coronary) risk increases with abnormal blood fat (lipid) levels. Heart-healthy meal planning includes limiting unhealthy fats, increasing healthy fats, and making other small dietary changes. This includes maintaining a healthy body weight to help keep lipid levels within a normal range. WHAT IS MY PLAN?  Your health care provider recommends that you:  Get no more than _________% of the total calories in your daily diet from fat.  Limit your intake of saturated fat to less than _________% of your total calories each day.  Limit the amount of cholesterol in your diet to less than _________ mg per day. WHAT TYPES OF FAT SHOULD I CHOOSE?  Choose healthy fats more often. Choose monounsaturated and polyunsaturated fats, such as olive oil and canola oil, flaxseeds, walnuts, almonds, and seeds.  Eat more omega-3 fats. Good choices include salmon, mackerel, sardines, tuna, flaxseed oil, and ground flaxseeds. Aim to eat fish at least two times each week.  Limit saturated fats. Saturated fats are primarily found in animal products, such as meats, butter, and cream. Plant sources of saturated fats include palm oil, palm kernel oil, and coconut oil.  Avoid foods with partially hydrogenated oils in them. These contain trans fats. Examples of foods that contain trans fats are stick margarine, some tub margarines, cookies, crackers, and other baked goods. WHAT GENERAL GUIDELINES DO I NEED TO FOLLOW?  Check food labels carefully to identify foods with trans fats or high amounts of saturated fat.  Fill one half of your plate with vegetables and green salads. Eat 4-5 servings of vegetables per day. A serving of vegetables equals 1 cup of raw leafy vegetables,  cup of raw or cooked cut-up  vegetables, or  cup of vegetable juice.  Fill one fourth of your plate with whole grains. Look for the word "whole" as the first word in the ingredient list.  Fill one fourth of your plate with lean protein foods.  Eat 4-5 servings of fruit per day. A serving of fruit equals one medium whole fruit,  cup of dried fruit,  cup of fresh, frozen, or canned fruit, or  cup of 100% fruit juice.  Eat more foods that contain soluble fiber. Examples of foods that contain this type of fiber are apples, broccoli, carrots, beans, peas, and barley. Aim to get 20-30 g of fiber per day.  Eat more home-cooked food and less restaurant, buffet, and fast food.  Limit or avoid alcohol.  Limit foods that are high in starch and sugar.  Avoid fried foods.  Cook foods by using methods other than frying. Baking, boiling, grilling, and broiling are all great options. Other fat-reducing suggestions include:  Removing the skin from poultry.  Removing all visible fats from meats.  Skimming the fat off of stews, soups, and gravies before serving them.  Steaming vegetables in water or broth.  Lose weight if you are overweight. Losing just 5-10% of your initial body weight can help your overall health and prevent diseases such as diabetes and heart disease.  Increase your consumption of nuts, legumes, and seeds to 4-5 servings per week. One serving of dried beans or legumes equals  cup after being cooked, one serving of nuts equals 1 ounces, and one serving of seeds equals  ounce or 1 tablespoon.  You may need to monitor your salt (  sodium) intake, especially if you have high blood pressure. Talk with your health care provider or dietitian to get more information about reducing sodium. WHAT FOODS CAN I EAT? Grains Breads, including [place], white, pita, wheat, raisin, rye, oatmeal, and [place]. Tortillas that are neither fried nor made with lard or trans fat. Low-fat rolls, including hotdog and hamburger buns  and English muffins. Biscuits. Muffins. Waffles. Pancakes. Light popcorn. Whole-grain cereals. Flatbread. Melba toast. Pretzels. Breadsticks. Rusks. Low-fat snacks and crackers, including oyster, saltine, matzo, graham, animal, and rye. Rice and pasta, including brown rice and those that are made with whole wheat. Vegetables All vegetables. Fruits All fruits, but limit coconut. Meats and Other Protein Sources Lean, well-trimmed beef, veal, pork, and lamb. Chicken and [place] without skin. All fish and shellfish. Wild duck, rabbit, pheasant, and venison. Egg whites or low-cholesterol egg substitutes. Dried beans, peas, lentils, and tofu.Seeds and most nuts. Dairy Low-fat or nonfat cheeses, including ricotta, string, and mozzarella. Skim or 1% milk that is liquid, powdered, or evaporated. Buttermilk that is made with low-fat milk. Nonfat or low-fat yogurt. Beverages Mineral water. Diet carbonated beverages. Sweets and Desserts Sherbets and fruit ices. Honey, jam, marmalade, jelly, and syrups. Meringues and gelatins. Pure sugar candy, such as hard candy, jelly beans, gumdrops, mints, marshmallows, and small amounts of dark chocolate. [employer]. Eat all sweets and desserts in moderation. Fats and Oils Nonhydrogenated (trans-free) margarines. Vegetable oils, including soybean, sesame, sunflower, olive, peanut, safflower, corn, canola, and cottonseed. Salad dressings or mayonnaise that are made with a vegetable oil. Limit added fats and oils that you use for cooking, baking, salads, and as spreads. Other Cocoa powder. Coffee and tea. All seasonings and condiments. The items listed above may not be a complete list of recommended foods or beverages. Contact your dietitian for more options. WHAT FOODS ARE NOT RECOMMENDED? Grains Breads that are made with saturated or trans fats, oils, or whole milk. Croissants. Butter rolls. Cheese breads. Sweet rolls. Donuts. Buttered popcorn. Chow mein noodles.  High-fat crackers, such as cheese or butter crackers. Meats and Other Protein Sources Fatty meats, such as hotdogs, short ribs, sausage, spareribs, bacon, ribeye roast or steak, and mutton. High-fat deli meats, such as salami and bologna. Caviar. Domestic duck and goose. Organ meats, such as kidney, liver, sweetbreads, brains, gizzard, chitterlings, and heart. Dairy Cream, sour cream, cream cheese, and creamed cottage cheese. Whole milk cheeses, including blue (bleu), [address], [place], [place], [address], [place], [address], [place], [place], and [place]. Whole or 2% milk that is liquid, evaporated, or condensed. Whole buttermilk. Cream sauce or high-fat cheese sauce. Yogurt that is made from whole milk. Beverages Regular sodas and drinks with added sugar. Sweets and Desserts Frosting. Pudding. Cookies. Cakes other than angel food cake. Candy that has milk chocolate or white chocolate, hydrogenated fat, butter, coconut, or unknown ingredients. Buttered syrups. Full-fat ice cream or ice cream drinks. Fats and Oils Gravy that has suet, meat fat, or shortening. Cocoa butter, hydrogenated oils, palm oil, coconut oil, palm kernel oil. These can often be found in baked products, candy, fried foods, nondairy creamers, and whipped toppings. Solid fats and shortenings, including bacon fat, salt pork, lard, and butter. Nondairy cream substitutes, such as coffee creamers and sour cream substitutes. Salad dressings that are made of unknown oils, cheese, or sour cream. The items listed above may not be a complete list of foods and beverages to avoid. Contact your dietitian for more information.   This information is not intended to replace advice  given to you by your health care provider. Make sure you discuss any questions you have with your health care provider.   Document Released: 03/26/2008 Document Revised: 07/08/2014 Document Reviewed: 12/09/2013 Elsevier Interactive Patient Education [employer].

## 2015-08-24 NOTE — Progress Notes (Signed)
Pre visit review using our clinic review tool, if applicable. No additional management support is needed unless otherwise documented below in the visit note. 

## 2016-08-22 ENCOUNTER — Other Ambulatory Visit (INDEPENDENT_AMBULATORY_CARE_PROVIDER_SITE_OTHER): Payer: Managed Care, Other (non HMO)

## 2016-08-22 DIAGNOSIS — Z0000 Encounter for general adult medical examination without abnormal findings: Secondary | ICD-10-CM | POA: Diagnosis not present

## 2016-08-22 LAB — BASIC METABOLIC PANEL
BUN: 17 mg/dL (ref 6–23)
CALCIUM: 9.5 mg/dL (ref 8.4–10.5)
CO2: 26 mEq/L (ref 19–32)
CREATININE: 1.37 mg/dL (ref 0.40–1.50)
Chloride: 107 mEq/L (ref 96–112)
GFR: 56.45 mL/min — AB (ref >60.00)
Glucose, Bld: 89 mg/dL (ref 70–99)
Potassium: 4.4 mEq/L (ref 3.5–5.1)
Sodium: 141 mEq/L (ref 135–145)

## 2016-08-22 LAB — HEPATIC FUNCTION PANEL
ALBUMIN: 4.3 g/dL (ref 3.5–5.2)
ALK PHOS: 74 U/L (ref 39–117)
ALT: 18 U/L (ref 0–53)
AST: 18 U/L (ref 0–37)
Bilirubin, Direct: 0.1 mg/dL (ref 0.0–0.3)
TOTAL PROTEIN: 6.7 g/dL (ref 6.0–8.3)
Total Bilirubin: 0.6 mg/dL (ref 0.2–1.2)

## 2016-08-22 LAB — CBC WITH DIFFERENTIAL/PLATELET
BASOS ABS: 0.1 10*3/uL (ref 0.0–0.1)
BASOS PCT: 2.1 % (ref 0.0–3.0)
EOS ABS: 0.4 10*3/uL (ref 0.0–0.7)
Eosinophils Relative: 6.2 % — ABNORMAL HIGH (ref 0.0–5.0)
HCT: 46.1 % (ref 39.0–52.0)
HEMOGLOBIN: 15.4 g/dL (ref 13.0–17.0)
LYMPHS PCT: 31.1 % (ref 12.0–46.0)
Lymphs Abs: 2 10*3/uL (ref 0.7–4.0)
MCHC: 33.3 g/dL (ref 30.0–36.0)
MCV: 90.5 fl (ref 78.0–100.0)
MONO ABS: 0.6 10*3/uL (ref 0.1–1.0)
Monocytes Relative: 9.1 % (ref 3.0–12.0)
Neutro Abs: 3.4 10*3/uL (ref 1.4–7.7)
Neutrophils Relative %: 51.5 % (ref 43.0–77.0)
Platelets: 210 10*3/uL (ref 150.0–400.0)
RBC: 5.09 Mil/uL (ref 4.22–5.81)
RDW: 13.1 % (ref 11.5–15.5)
WBC: 6.5 10*3/uL (ref 4.0–10.5)

## 2016-08-22 LAB — POC URINALSYSI DIPSTICK (AUTOMATED)
BILIRUBIN UA: NEGATIVE
GLUCOSE UA: NEGATIVE
KETONES UA: NEGATIVE
Leukocytes, UA: NEGATIVE
Nitrite, UA: NEGATIVE
Protein, UA: NEGATIVE
Urobilinogen, UA: 0.2
pH, UA: 5

## 2016-08-22 LAB — LIPID PANEL
CHOL/HDL RATIO: 3
CHOLESTEROL: 170 mg/dL (ref 0–200)
HDL: 48.9 mg/dL (ref >39.00)
LDL CALC: 105 mg/dL — AB (ref 0–99)
NonHDL: 121.59
Triglycerides: 82 mg/dL (ref 0.0–149.0)
VLDL: 16.4 mg/dL (ref 0.0–40.0)

## 2016-08-22 LAB — TSH: TSH: 0.96 u[IU]/mL (ref 0.35–4.50)

## 2016-08-22 LAB — PSA: PSA: 0.69 ng/mL (ref 0.10–4.00)

## 2016-08-29 ENCOUNTER — Encounter: Payer: Self-pay | Admitting: Family Medicine

## 2016-08-29 ENCOUNTER — Ambulatory Visit (INDEPENDENT_AMBULATORY_CARE_PROVIDER_SITE_OTHER): Payer: Managed Care, Other (non HMO) | Admitting: Family Medicine

## 2016-08-29 VITALS — BP 108/72 | HR 80 | Temp 98.2°F | Ht 63.0 in | Wt 117.0 lb

## 2016-08-29 DIAGNOSIS — L209 Atopic dermatitis, unspecified: Secondary | ICD-10-CM | POA: Diagnosis not present

## 2016-08-29 DIAGNOSIS — F17200 Nicotine dependence, unspecified, uncomplicated: Secondary | ICD-10-CM

## 2016-08-29 DIAGNOSIS — E785 Hyperlipidemia, unspecified: Secondary | ICD-10-CM | POA: Diagnosis not present

## 2016-08-29 DIAGNOSIS — Z0000 Encounter for general adult medical examination without abnormal findings: Secondary | ICD-10-CM

## 2016-08-29 MED ORDER — TRIAMCINOLONE ACETONIDE 0.025 % EX OINT: 1.0000 "application " | TOPICAL_OINTMENT | Freq: Two times a day (BID) | CUTANEOUS | 0 refills | Status: DC

## 2016-08-29 MED ORDER — ATORVASTATIN CALCIUM 20 MG PO TABS: 20.0000 mg | ORAL_TABLET | Freq: Every day | ORAL | 3 refills | Status: DC

## 2016-08-29 NOTE — Patient Instructions (Signed)
It was a pleasure to see you today! Please drink plenty of water, enough to keep your urine pale yellow or clear.  A referral for a low dose CT scan has been placed for you. You will be contacted regarding this information. Follow up for a physical in one year or sooner if needed.   Health Maintenance, Male A healthy lifestyle and preventive care is important for your health and wellness. Ask your health care provider about what schedule of regular examinations is right for you. What should I know about weight and diet?  Eat a Healthy Diet  Eat plenty of vegetables, fruits, whole grains, low-fat dairy products, and lean protein.  Do not eat a lot of foods high in solid fats, added sugars, or salt. Maintain a Healthy Weight  Regular exercise can help you achieve or maintain a healthy weight. You should:  Do at least 150 minutes of exercise each week. The exercise should increase your heart rate and make you sweat (moderate-intensity exercise).  Do strength-training exercises at least twice a week. Watch Your Levels of Cholesterol and Blood Lipids  Have your blood tested for lipids and cholesterol every 5 years starting at 60 years of age. If you are at high risk for heart disease, you should start having your blood tested when you are 60 years old. You may need to have your cholesterol levels checked more often if:  Your lipid or cholesterol levels are high.  You are older than 60 years of age.  You are at high risk for heart disease. What should I know about cancer screening? Many types of cancers can be detected early and may often be prevented. Lung Cancer  You should be screened every year for lung cancer if:  You are a current smoker who has smoked for at least 30 years.  You are a former smoker who has quit within the past 15 years.  Talk to your health care provider about your screening options, when you should start screening, and how often you should be  screened. Colorectal Cancer  Routine colorectal cancer screening usually begins at 60 years of age and should be repeated every 5-10 years until you are 60 years old. You may need to be screened more often if early forms of precancerous polyps or small growths are found. Your health care provider may recommend screening at an earlier age if you have risk factors for colon cancer.  Your health care provider may recommend using home test kits to check for hidden blood in the stool.  A small camera at the end of a tube can be used to examine your colon (sigmoidoscopy or colonoscopy). This checks for the earliest forms of colorectal cancer. Prostate and Testicular Cancer  Depending on your age and overall health, your health care provider may do certain tests to screen for prostate and testicular cancer.  Talk to your health care provider about any symptoms or concerns you have about testicular or prostate cancer. Skin Cancer  Check your skin from head to toe regularly.  Tell your health care provider about any new moles or changes in moles, especially if:  There is a change in a mole's size, shape, or color.  You have a mole that is larger than a pencil eraser.  Always use sunscreen. Apply sunscreen liberally and repeat throughout the day.  Protect yourself by wearing long sleeves, pants, a wide-brimmed hat, and sunglasses when outside. What should I know about heart disease, diabetes, and high blood  pressure?  If you are 22-6 years of age, have your blood pressure checked every 3-5 years. If you are 62 years of age or older, have your blood pressure checked every year. You should have your blood pressure measured twice-once when you are at a hospital or clinic, and once when you are not at a hospital or clinic. Record the average of the two measurements. To check your blood pressure when you are not at a hospital or clinic, you can use:  An automated blood pressure machine at a  pharmacy.  A home blood pressure monitor.  Talk to your health care provider about your target blood pressure.  If you are between 41-15 years old, ask your health care provider if you should take aspirin to prevent heart disease.  Have regular diabetes screenings by checking your fasting blood sugar level.  If you are at a normal weight and have a low risk for diabetes, have this test once every three years after the age of 23.  If you are overweight and have a high risk for diabetes, consider being tested at a younger age or more often.  A one-time screening for abdominal aortic aneurysm (AAA) by ultrasound is recommended for men aged 81-75 years who are current or former smokers. What should I know about preventing infection? Hepatitis B  If you have a higher risk for hepatitis B, you should be screened for this virus. Talk with your health care provider to find out if you are at risk for hepatitis B infection. Hepatitis C  Blood testing is recommended for:  Everyone born from [birth year] through 1964.  Anyone with known risk factors for hepatitis C. Sexually Transmitted Diseases (STDs)  You should be screened each year for STDs including gonorrhea and chlamydia if:  You are sexually active and are younger than 60 years of age.  You are older than 60 years of age and your health care provider tells you that you are at risk for this type of infection.  Your sexual activity has changed since you were last screened and you are at an increased risk for chlamydia or gonorrhea. Ask your health care provider if you are at risk.  Talk with your health care provider about whether you are at high risk of being infected with HIV. Your health care provider may recommend a prescription medicine to help prevent HIV infection. What else can I do?  Schedule regular health, dental, and eye exams.  Stay current with your vaccines (immunizations).  Do not use any tobacco products, such as  cigarettes, chewing tobacco, and e-cigarettes. If you need help quitting, ask your health care provider.  Limit alcohol intake to no more than 2 drinks per day. One drink equals 12 ounces of beer, 5 ounces of wine, or 1 ounces of hard liquor.  Do not use street drugs.  Do not share needles.  Ask your health care provider for help if you need support or information about quitting drugs.  Tell your health care provider if you often feel depressed.  Tell your health care provider if you have ever been abused or do not feel safe at home. This information is not intended to replace advice given to you by your health care provider. Make sure you discuss any questions you have with your health care provider. Document Released: 12/14/2007 Document Revised: 02/14/2016 Document Reviewed: 03/21/2015 Elsevier Interactive Patient Education  2017 [employer].

## 2016-08-29 NOTE — Progress Notes (Signed)
Pre visit review using our clinic review tool, if applicable. No additional management support is needed unless otherwise documented below in the visit note. 

## 2016-08-29 NOTE — Progress Notes (Addendum)
Subjective:    Patient ID: Kurt Duncan, male    DOB: 24-Oct-1955, 61 y.o.   MRN: [id number]  HPI  Mr. Kurt Duncan is a 61 year old male who presents today for routine annual exam.  I am seeing him in place of his PCP who is not available today.    He follows a heart healthy diet, preferring vegetables to meat. No regular exercise program in place, he reports using stairs when possible and parking at a distance to walk as much in his daily life. He denies cardiopulmonary symptoms with stair use. He reports drinking 5 cups of coffee/day and states that he is aware that he needs to increase his water intake. Preventive Colonoscopy in 2011 with follow up in 2021.  He denies any blood in stool, thin caliber stools, or genitourinary symptoms.  He sees a Education officer, community and periodontitis every 6 months.  He reports smoking 0.5 ppd and states that he has not cut back. He has a new granddaughter and states that he does not smoke around her and only smokes outside. Tdap is UTD  Declines influenza vaccine   Review of Systems  Constitutional: No fever, chills, significant weight change, fatigue, weakness or night sweats Eyes: No redness, discharge, pain, blurred vision, double vision, or loss of vision ENT/mouth: No nasal congestion, postnasal drainage,epistaxis, purulent discharge, earache, hearing loss, tinnitus ,sore throat , dental pain, or hoarseness   Cardiovascular: no chest pain, palpitations, racing, irregular rhythm, syncope, nausea, sweating, claudication, or edema  Respiratory: No cough, sputum production,hemoptysis,  dyspnea, paroxysmal nocturnal dyspnea, pleuritic chest pain, significant snoring, or  apnea    Gastrointestinal: No heartburn,dysphagia, nausea and vomiting,ominal pain, change in bowels, anorexia, diarrhea, significant constipation, rectal bleeding, melena,  stool incontinence or jaundice Genitourinary: No dysuria,hematuria, pyuria, frequency, urgency,  incontinence, nocturia,  dark urine or flank pain Musculoskeletal: No myalgias or muscle cramping, joint stiffness, joint swelling, joint color change, weakness, or cyanosis Dermatologic: No rash, pruritus, urticaria, or change in color or temperature of skin Neurologic: No headache, vertigo, limb weakness, tremor, gait disturbance, seizures, memory loss, numbness or tingling Psychiatric: No significant anxiety or depression, anhedonia, panic attacks, insomnia, or anorexia Endocrine: No change in hair/skin/ nails, excessive thirst, excessive hunger, excessive urination, or unexplained fatigue Hematologic/lymphatic: No bruising, lymphadenopathy,or  abnormal clotting Allergy/immunology: No itchy/ watery eyes, abnormal sneezing, rhinitis, urticaria ,or angioedema  Past Medical History:  Diagnosis Date  . History of pyelonephritis      Social History   Social History  . Marital status: Married    Spouse name: N/A  . Number of children: 3  . Years of education: N/A   Occupational History  . 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 Smoking status: Current Every Day Smoker    Packs/day: 0.50    Years: 48.00    Types: Cigarettes  . Smokeless tobacco: Current User     Comment: Encouaged patient to quit. He is interested and he states that his smoking is a "pasttime"  . Alcohol use No  . Drug use: No  . Sexual activity: Not on file   Other Topics Concern  . 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 COLONOSCOPY  2012   Normal    Family History  Problem Relation Age of Onset  . Hypertension Mother   . Cancer Father 45    brain    No Known Allergies  No current outpatient prescriptions on file prior to visit.   No current facility-administered medications on  file prior to visit.     BP 108/72 (BP Location: Left Arm, Patient Position: Sitting, Cuff Size: Normal)   Pulse 80   Temp 98.2 F (36.8 C) (Oral)   Ht 5\' 3"  (1.6 m)   Wt 117 lb (53.1 kg)   SpO2 98%   BMI 20.73 kg/m       Objective:   Physical Exam  Physical Exam  Constitutional: He is oriented to person, place, and time. He appears well-developed and well-nourished. No distress.  HENT:  Head: Normocephalic and atraumatic.  Right Ear: Tympanic membrane and ear canal normal.  Left Ear: Tympanic membrane and ear canal normal.  Mouth/Throat: Oropharynx is clear and moist.  Eyes: Pupils are equal, round, and reactive to light. No scleral icterus.  Neck: Normal range of motion. No thyromegaly present.  Cardiovascular: Normal rate and regular rhythm.   No murmur heard. Pulmonary/Chest: Effort normal and breath sounds normal. No respiratory distress. He has no wheezes. He has no rales. He exhibits no tenderness.  Abdominal: Soft. Bowel sounds are normal. He exhibits no distension and no mass. There is no tenderness. There is no rebound and no guarding.  Musculoskeletal: He exhibits no edema.  Lymphadenopathy:    He has no cervical adenopathy.  Neurological: He is alert and oriented to person, place, and time. He has normal reflexes. He exhibits normal muscle tone. Coordination normal.  Skin: Skin is warm and dry, quarter size area of dryness, and lichenification noted on upper right shoulder. No erythema, oozing, or crusting present   Psychiatric: He has a normal mood and affect. His behavior is normal. Judgment and thought content normal.        Assessment & Plan:  1. Routine general medical examination at a health care facility 61 y.o. male presenting for annual physical.  Health Maintenance counseling: 1. Anticipatory guidance: Patient counseled regarding regular dental exams, eye exams, wearing seatbelts.  2. Risk factor reduction:  Advised patient of need for regular  exercise and diet rich and fruits and vegetables to reduce risk of heart attack and stroke.  3. Immunizations/screenings/ancillary studies: Declines influenza vaccine today.  Immunization History  Administered Date(s) Administered  . Tdap 08/10/2014   Health Maintenance Due  Topic Date Due  . Hepatitis C Screening  20-Oct-1955  . HIV Screening  03/29/1972  . COLONOSCOPY  03/30/2007   4. Prostate cancer screening- PSA WNL  Lab Results  Component Value Date   PSA 0.69 08/22/2016   PSA 0.71 08/07/2015   PSA 0.77 08/03/2014   5. Colon cancer screening - UTD; Next screening due 2021 6. Skin cancer screening- Scattered macules noted on arms and back. No suspicious lesions noted. Discussed ABCDEs of lesions and advised use of sunscreen and reporting any skin changes for further evaluation.  2. Tobacco use disorder Tobacco Cessation- I have discussed the risks of tobacco use with the patient, which includes but are not limited to lung, throat,stomach, pancreatic, and colon cancer,as well as COPD. The type and daily amount of tobacco used, as well as the patients willingness to quit were accessed. I discussed with the patient the different medical interventions, their usefulness, effectiveness and side effects. Pt declines medical intervention  at this time. Will continue to discuss in further visits and patient encouraged to return if they need assistance with cessation in the future.   We discussed low dose CT scan option and he is interested.  - CT CHEST LUNG CANCER SCREENING LOW DOSE WO CONTRAST; Future  3. Hyperlipidemia, unspecified hyperlipidemia type Controlled; continue atorvastatin.   4. Atopic dermatitis, unspecified type Controlled, mild flare noted on upper right shoulder; kenalog for symptoms as needed  Roddie McJulia Taja Pentland, FNP-C

## 2016-09-09 ENCOUNTER — Other Ambulatory Visit: Payer: Self-pay

## 2016-09-09 DIAGNOSIS — F17200 Nicotine dependence, unspecified, uncomplicated: Secondary | ICD-10-CM

## 2016-09-12 ENCOUNTER — Other Ambulatory Visit: Payer: Self-pay | Admitting: Acute Care

## 2016-09-12 DIAGNOSIS — F17210 Nicotine dependence, cigarettes, uncomplicated: Principal | ICD-10-CM

## 2016-09-13 ENCOUNTER — Other Ambulatory Visit: Payer: Self-pay | Admitting: Family Medicine

## 2016-09-13 DIAGNOSIS — F17210 Nicotine dependence, cigarettes, uncomplicated: Principal | ICD-10-CM

## 2016-10-09 ENCOUNTER — Encounter: Payer: Self-pay | Admitting: Acute Care

## 2016-10-09 ENCOUNTER — Ambulatory Visit (INDEPENDENT_AMBULATORY_CARE_PROVIDER_SITE_OTHER)
Admission: RE | Admit: 2016-10-09 | Discharge: 2016-10-09 | Disposition: A | Discharge status: Home / Self Care | Payer: Managed Care, Other (non HMO) | Source: Ambulatory Visit | Attending: Acute Care | Admitting: Acute Care

## 2016-10-09 ENCOUNTER — Ambulatory Visit (INDEPENDENT_AMBULATORY_CARE_PROVIDER_SITE_OTHER): Payer: Managed Care, Other (non HMO) | Admitting: Acute Care

## 2016-10-09 DIAGNOSIS — Z87891 Personal history of nicotine dependence: Secondary | ICD-10-CM | POA: Diagnosis not present

## 2016-10-09 DIAGNOSIS — F17210 Nicotine dependence, cigarettes, uncomplicated: Secondary | ICD-10-CM

## 2016-10-09 NOTE — Progress Notes (Signed)
Shared Decision Making Visit Lung Cancer Screening Program [phone])   Eligibility:  Age 61 y.o.  Pack Years Smoking History Calculation 36.75 pack years (# packs/per year x # years smoked)  Recent History of coughing up blood  no  Unexplained weight loss? no ( >Than 15 pounds within the last 6 months )  Prior History Lung / other cancer no (Diagnosis within the last 5 years already requiring surveillance chest CT Scans).  Smoking Status Current Smoker  Former Smokers: Years since quit: NA  Quit Date: NA  Visit Components:  Discussion included one or more decision making aids. yes  Discussion included risk/benefits of screening. yes  Discussion included potential follow up diagnostic testing for abnormal scans. yes  Discussion included meaning and risk of over diagnosis. yes  Discussion included meaning and risk of False Positives. yes  Discussion included meaning of total radiation exposure. yes  Counseling Included:  Importance of adherence to annual lung cancer LDCT screening. yes  Impact of comorbidities on ability to participate in the program. yes  Ability and willingness to under diagnostic treatment. yes  Smoking Cessation Counseling:  Current Smokers:   Discussed importance of smoking cessation. yes  Information about tobacco cessation classes and interventions provided to patient. yes  Patient provided with "ticket" for LDCT Scan. yes  Symptomatic Patient. no  Counseling  Diagnosis Code: Tobacco Use Z72.0  Asymptomatic Patient yes  Counseling (Intermediate counseling: > three minutes counseling) U0454  Former Smokers:   Discussed the importance of maintaining cigarette abstinence. yes  Diagnosis Code: Personal History of Nicotine Dependence. U98.119  Information about tobacco cessation classes and interventions provided to patient. Yes  Patient provided with "ticket" for LDCT Scan. yes  Written Order for Lung Cancer Screening with LDCT  placed in Epic. Yes (CT Chest Lung Cancer Screening Low Dose W/O CM) [id number] Z12.2-Screening of respiratory organs Z87.891-Personal history of nicotine dependence  I have spent 25 minutes of face to face time with Mr. Doyon discussing the risks and benefits of lung cancer screening. We viewed a power point together that explained in detail the above noted topics. We paused at intervals to allow for questions to be asked and answered to ensure understanding.We discussed that the single most powerful action that he can take to decrease his risk of developing lung cancer is to quit smoking. We discussed whether or not he is ready to commit to setting a quit date. He is not ready to set a quit date.We discussed options for tools to aid in quitting smoking including nicotine replacement therapy, non-nicotine medications, support groups, Quit Smart classes, and behavior modification. We discussed that often times setting smaller, more achievable goals, such as eliminating 1 cigarette a day for a week and then 2 cigarettes a day for a week can be helpful in slowly decreasing the number of cigarettes smoked. This allows for a sense of accomplishment as well as providing a clinical benefit. I gave him the " Be Stronger Than Your Excuses" card with contact information for community resources, classes, free nicotine replacement therapy, and access to mobile apps, text messaging, and on-line smoking cessation help. I have also given him my card and contact information in the event he needs to contact me. We discussed the time and location of the scan, and that either Abigail Miyamoto, RN , or I will call with the results within 24-48 hours of receiving them. I have provided him  with a copy of the power point we viewed  as a  resource in the event they need reinforcement of the concepts we discussed today in the office. The patient verbalized understanding of all of  the above and had no further questions upon leaving the  office. They have my contact information in the event they have any further questions.  I spent 4 minutes of today's visit counseling the patient on smoking cessation.  I explained that there has been a high incidence of CAD findings with this non-gated scan. I explained that degree or severity cannot be determined by this scan. Mr. Kedzierski is currently on statin therapy per his PCP. I explained that I will send a copy of the scan to the patient's PCP. He verbalized understanding of the above.   Bevelyn Ngo, NP 10/09/2016

## 2016-10-16 ENCOUNTER — Other Ambulatory Visit: Payer: Self-pay | Admitting: Acute Care

## 2016-10-16 DIAGNOSIS — F17210 Nicotine dependence, cigarettes, uncomplicated: Principal | ICD-10-CM

## 2017-03-20 ENCOUNTER — Encounter: Payer: Self-pay | Admitting: Family Medicine

## 2017-04-07 ENCOUNTER — Encounter: Payer: Self-pay | Admitting: Adult Health

## 2017-04-07 ENCOUNTER — Ambulatory Visit (INDEPENDENT_AMBULATORY_CARE_PROVIDER_SITE_OTHER): Payer: Managed Care, Other (non HMO) | Admitting: Adult Health

## 2017-04-07 VITALS — BP 110/64 | Temp 98.0°F | Wt 119.0 lb

## 2017-04-07 DIAGNOSIS — L03116 Cellulitis of left lower limb: Secondary | ICD-10-CM

## 2017-04-07 MED ORDER — CEPHALEXIN 500 MG PO CAPS: 500.0000 mg | ORAL_CAPSULE | Freq: Three times a day (TID) | ORAL | 0 refills | Status: AC

## 2017-04-07 MED ORDER — PREDNISONE 10 MG PO TABS: ORAL_TABLET | ORAL | 0 refills | Status: DC

## 2017-04-07 NOTE — Progress Notes (Signed)
Subjective:    Patient ID: Kurt Duncan, male    DOB: 01-May-1956, 61 y.o.   MRN: [id number]  HPI  61 year old male who presents to the office today for " rash: on his left shin. He reports a red, warm patch that has been present for 1-2 months. He has been applying kenalog cream but has not noticed any improvement and feels as though it is getting worse. He does endorses episodes of drainage, usually after he itches the site.   Denies any recent bug bites or trauma   Review of Systems See HPI   Past Medical History:  Diagnosis Date  . History of pyelonephritis     Social History   Social History  . Marital status: Married    Spouse name: N/A  . Number of children: 3  . Years of education: N/A   Occupational History  . 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 Smoking status: Current Every Day Smoker    Packs/day: 0.75    Years: 48.00    Types: Cigarettes  . Smokeless tobacco: Current User     Comment: Encouaged patient to quit. He is interested and he states that his smoking is a "pasttime"  . Alcohol use No  . Drug use: No  . Sexual activity: Not on file   Other Topics Concern  . 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 COLONOSCOPY  2012   Normal    Family History  Problem Relation Age of Onset  . Hypertension Mother   . Cancer Father 45       brain    No Known Allergies  Current Outpatient Prescriptions on File Prior to Visit  Medication Sig Dispense Refill  . atorvastatin (LIPITOR) 20 MG tablet Take 1 tablet (20 mg total) by mouth daily. 90 tablet 3  . triamcinolone (KENALOG) 0.025 % ointment Apply 1 application topically 2 (two) times daily. 30 g 0   No current facility-administered medications on file prior  to visit.     BP 110/64 (BP Location: Left Arm)   Temp 98 F (36.7 C) (Oral)   Wt 119 lb (54 kg)   BMI 21.08 kg/m       Objective:   Physical Exam  Constitutional: He is oriented to person, place, and time. He appears well-developed and well-nourished. No distress.  Cardiovascular: Normal rate, regular rhythm, normal heart sounds and intact distal pulses.  Exam reveals no gallop and no friction rub.   No murmur heard. Pulmonary/Chest: Effort normal and breath sounds normal. No respiratory distress. He has no wheezes. He has no rales. He exhibits no tenderness.  Neurological: He is alert and oriented to person, place, and time.  Skin: Skin is warm and dry. He is not diaphoretic. There is erythema. No pallor.  Warmness noted. No drainage currently. No streaking   Psychiatric: He has a normal mood and affect. His behavior is normal. Judgment and thought content normal.  Nursing note and vitals reviewed.     Assessment & Plan:  1. Cellulitis of left lower extremity - Exam consistent with cellulitis. Will prescribed Keflex and prednisone. Follow up in one week or sooner if worsens  -  cephALEXin (KEFLEX) 500 MG capsule; Take 1 capsule (500 mg total) by mouth 3 (three) times daily.  Dispense: 30 capsule; Refill: 0 - predniSONE (DELTASONE) 10 MG tablet; 40 mg x 3 days, 20 mg x 3 days, 10 mg x 3 days  Dispense: 21 tablet; Refill: 0   Shirline Frees, NP

## 2017-04-07 NOTE — Patient Instructions (Signed)
It was great meeting you today   Your rash looks to be cellulitis.   I have prescribed a antibiotic ( Keflex) and prednisone. Take each medication as directed

## 2017-04-15 ENCOUNTER — Ambulatory Visit (INDEPENDENT_AMBULATORY_CARE_PROVIDER_SITE_OTHER): Payer: Managed Care, Other (non HMO) | Admitting: Adult Health

## 2017-04-15 ENCOUNTER — Encounter: Payer: Self-pay | Admitting: Adult Health

## 2017-04-15 VITALS — BP 110/80 | Temp 98.1°F | Wt 118.0 lb

## 2017-04-15 DIAGNOSIS — L03116 Cellulitis of left lower limb: Secondary | ICD-10-CM | POA: Diagnosis not present

## 2017-04-15 NOTE — Progress Notes (Signed)
   Subjective:    Patient ID: Kurt Duncan, male    DOB: 07-Nov-1955, 61 y.o.   MRN: [id number]  HPI  61 year old male who  has a past medical history of History of pyelonephritis. He presents to the office today for one week follow up regarding perceived cellulitis of left lower extremity. He was prescribed a course of Keflex TID and Prednisone taper. Today in the office he reports that his " rash" has improved significantly.   He denies any complaints    Review of Systems See HPI   Past Medical History:  Diagnosis Date  . History of pyelonephritis     Social History   Social History  . Marital status: Married    Spouse name: N/A  . Number of children: 3  . Years of education: N/A   Occupational History  . 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 Smoking status: Current Every Day Smoker    Packs/day: 0.75    Years: 48.00    Types: Cigarettes  . Smokeless tobacco: Current User     Comment: Encouaged patient to quit. He is interested and he states that his smoking is a "pasttime"  . Alcohol use No  . Drug use: No  . Sexual activity: Not on file   Other Topics Concern  . 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 COLONOSCOPY  2012   Normal    Family History  Problem Relation Age of Onset  . Hypertension Mother   . Cancer Father 45       brain    No Known Allergies  Current Outpatient Prescriptions on File Prior to Visit  Medication Sig Dispense Refill  . cephALEXin (KEFLEX) 500 MG capsule Take 1 capsule (500 mg total) by mouth 3 (three) times daily. 30 capsule 0  . predniSONE (DELTASONE) 10 MG tablet 40 mg x 3 days, 20 mg x 3 days, 10 mg x 3 days 21 tablet 0   No current facility-administered medications on file prior  to visit.     BP 110/80 (BP Location: Left Arm)   Temp 98.1 F (36.7 C) (Oral)   Wt 118 lb (53.5 kg)   BMI 20.90 kg/m       Objective:   Physical Exam  Constitutional: He is oriented to person, place, and time. He appears well-developed and well-nourished. No distress.  Cardiovascular: Normal heart sounds.   Neurological: He is alert and oriented to person, place, and time.  Skin: Skin is warm and dry. He is not diaphoretic.  Small area of discoloration on left shin. No longer warm or red. Dry skin noted.     Psychiatric: He has a normal mood and affect. His behavior is normal. Judgment and thought content normal.  Nursing note and vitals reviewed.     Assessment & Plan:  1. Cellulitis of left lower extremity - finish abx and prednisone  - Appears resolved  - Follow up as needed  Shirline Frees, NP

## 2017-05-20 ENCOUNTER — Ambulatory Visit: Payer: Managed Care, Other (non HMO) | Admitting: Family Medicine

## 2017-05-20 ENCOUNTER — Encounter: Payer: Self-pay | Admitting: Family Medicine

## 2017-05-20 VITALS — BP 120/80 | HR 69 | Temp 97.9°F | Resp 12 | Ht 63.0 in | Wt 119.5 lb

## 2017-05-20 DIAGNOSIS — L309 Dermatitis, unspecified: Secondary | ICD-10-CM

## 2017-05-20 MED ORDER — ALUM SULFATE-CA ACETATE EX PACK: 1.0000 | PACK | Freq: Three times a day (TID) | CUTANEOUS | 2 refills | Status: DC | PRN

## 2017-05-20 MED ORDER — CLOBETASOL PROPIONATE 0.05 % EX CREA: 1.0000 "application " | TOPICAL_CREAM | Freq: Two times a day (BID) | CUTANEOUS | 0 refills | Status: DC

## 2017-05-20 NOTE — Progress Notes (Signed)
ACUTE VISIT   HPI:  Chief Complaint  Patient presents with  . Rash on left leg    Kurt Duncan is a 61 y.o. male, who is here today complaining of a month or so of persistent erythema on left pretibial area. He has completed Prednisone and abx treatment, these helped but problem reoccurred.  "Oozing" of clear drainage.  Affected area is pruritic, not tender. He has also noted intermittent "oozing", clear "wipping" for the past 2 weeks.  No fever,chills,myalgias,or fatigue.  For years he has dryness on pretibial areas, bilateral. He is not aware of Hx of eczema, his child does. Dry skin is worse with cold weather.  Mild LLE edema, intermittent,exacernated by prolonged standing and better in the morning.   He has not identified exacerbating factors. OTC Gold lotion helps with pruritus.  He had an episode of hives a week ago, not sure about precipitating factors, resolved after using calamine.  Problem is overall stable.  Review of Systems  Constitutional: Negative for appetite change, chills, fatigue, fever and unexpected weight change.  HENT: Negative for mouth sores, nosebleeds and sore throat.   Respiratory: Negative for cough, shortness of breath and wheezing.   Cardiovascular: Positive for leg swelling. Negative for chest pain and palpitations.  Gastrointestinal: Negative for abdominal pain, nausea and vomiting.       No changes in bowel habits.  Endocrine: Negative for polydipsia, polyphagia and polyuria.  Genitourinary: Negative for decreased urine volume and hematuria.  Musculoskeletal: Negative for joint swelling and myalgias.  Skin: Positive for rash. Negative for wound.  Allergic/Immunologic: Positive for environmental allergies.  Neurological: Negative for weakness and numbness.  Hematological: Negative for adenopathy. Does not bruise/bleed easily.      No current outpatient medications on file prior to visit.   No current  facility-administered medications on file prior to visit.      Past Medical History:  Diagnosis Date  . History of pyelonephritis    No Known Allergies  Social History   Socioeconomic History  . Marital status: Married    Spouse name: None  . Number of children: 3  . Years of education: None  . Highest education level: None  Social Needs  . Financial resource strain: None  . Food insecurity - worry: None  . Food insecurity - inability: None  . Transportation needs - medical: None  . Transportation needs - non-medical: None  Occupational History  . Occupation: OceanographerMechanical engineer    Employer: [employer]  Tobacco Use  . Smoking status: Current Every Day Smoker    Packs/day: 0.75    Years: 48.00    Pack years: 36.00    Types: Cigarettes  . Smokeless tobacco: Current User  . Tobacco comment: Encouaged patient to quit. He is interested and he states that his smoking is a "pasttime"  Substance and Sexual Activity  . Alcohol use: No    Alcohol/week: 0.0 oz  . Drug use: No  . Sexual activity: None  Other Topics Concern  . None  Social History Narrative   Works as a Careers adviserengineer   Likes to "sleep" and rest because he is a Development worker, international aid"workaholic"   Computer and watch TV   Married 30 years   3 daughters ages 7129, 9426, 717    Vitals:   05/20/17 0810  BP: 120/80  Pulse: 69  Resp: 12  Temp: 97.9 F (36.6 C)  SpO2: 97%   Body mass index is 21.17 kg/m.   Physical  Exam  Nursing note and vitals reviewed. Constitutional: He is oriented to person, place, and time. He appears well-developed and well-nourished. No distress.  HENT:  Head: Normocephalic and atraumatic.  Mouth/Throat: Oropharynx is clear and moist and mucous membranes are normal.  Eyes: Conjunctivae are normal.  Cardiovascular: Normal rate and regular rhythm.  No murmur heard. Pulses:      Dorsalis pedis pulses are 2+ on the right side, and 2+ on the left side.  Minimal varicose veins bilateral.  Respiratory:  Effort normal and breath sounds normal. No respiratory distress.  GI: Soft. He exhibits no mass. There is no hepatosplenomegaly. There is no tenderness.  Musculoskeletal: He exhibits no edema or tenderness.       Left knee: He exhibits normal range of motion, no effusion, no deformity and no erythema.       Left lower leg: He exhibits no tenderness.  Lymphadenopathy:    He has no cervical adenopathy.  Neurological: He is alert and oriented to person, place, and time. He has normal strength. Gait normal.  Skin: Skin is warm. Rash noted. Rash is macular. No erythema.     Macular erythema on pretibial aspect of left leg: 12 cm x 9 cm. No vesicular lesions, clear exudate, no local heat and no tenderness. Dry skin with scaly pretibial area RLE  Psychiatric: He has a normal mood and affect.  Well groomed, good eye contact.     ASSESSMENT AND PLAN:   Kurt Duncan was seen today for rash on left leg.  Diagnoses and all orders for this visit:  Dermatitis  We dicussed possible etiologies, I think is is most likely eczema but stasis vein dermatitis is also to be considered. For now I did not recommend blood work up. Options: topical steroid of dermatology referral, he prefers to hold on referral. Clobetasol bid x 14 days,side effects discussed. Domeboro may help with exudate, recommend 1-2 daily prn. Keep area clean with soap and water. Monitor for new symptoms.  -     clobetasol cream (TEMOVATE) 0.05 %; Apply 1 application 2 (two) times daily topically. 14 days -     aluminum sulfate-calcium acetate (DOMEBORO) packet; Apply 1 packet 3 (three) times daily as needed topically.    Return in about 3 weeks (around 06/10/2017) for dermatitis.     -KurtMarquita PalmsRicardo Sem was advised to seek immediate medical attention if sudden worsening symptoms.      Zalmen Wrightsman G. [place], MD  [facility]. [place] office.

## 2017-05-20 NOTE — Patient Instructions (Addendum)
A few things to remember from today's visit:   Dermatitis - Plan: clobetasol cream (TEMOVATE) 0.05 %, aluminum sulfate-calcium acetate (DOMEBORO) packet    Eczema Eczema, also called atopic dermatitis, is a skin disorder that causes inflammation of the skin. It causes a red rash and dry, scaly skin. The skin becomes very itchy. Eczema is generally worse during the cooler winter months and often improves with the warmth of summer. Eczema usually starts showing signs in infancy. Some children outgrow eczema, but it may last through adulthood. What are the causes? The exact cause of eczema is not known, but it appears to run in families. People with eczema often have a family history of eczema, allergies, asthma, or hay fever. Eczema is not contagious. Flare-ups of the condition may be caused by:  Contact with something you are sensitive or allergic to.  Stress.  What are the signs or symptoms?  Dry, scaly skin.  Red, itchy rash.  Itchiness. This may occur before the skin rash and may be very intense. How is this diagnosed? The diagnosis of eczema is usually made based on symptoms and medical history. How is this treated? Eczema cannot be cured, but symptoms usually can be controlled with treatment and other strategies. A treatment plan might include:  Controlling the itching and scratching. ? Use over-the-counter antihistamines as directed for itching. This is especially useful at night when the itching tends to be worse. ? Use over-the-counter steroid creams as directed for itching. ? Avoid scratching. Scratching makes the rash and itching worse. It may also result in a skin infection (impetigo) due to a break in the skin caused by scratching.  Keeping the skin well moisturized with creams every day. This will seal in moisture and help prevent dryness. Lotions that contain alcohol and water should be avoided because they can dry the skin.  Limiting exposure to things that you are  sensitive or allergic to (allergens).  Recognizing situations that cause stress.  Developing a plan to manage stress.  Follow these instructions at home:  Only take over-the-counter or prescription medicines as directed by your health care provider.  Do not use anything on the skin without checking with your health care provider.  Keep baths or showers short (5 minutes) in warm (not hot) water. Use mild cleansers for bathing. These should be unscented. You may add nonperfumed bath oil to the bath water. It is best to avoid soap and bubble bath.  Immediately after a bath or shower, when the skin is still damp, apply a moisturizing ointment to the entire body. This ointment should be a petroleum ointment. This will seal in moisture and help prevent dryness. The thicker the ointment, the better. These should be unscented.  Keep fingernails cut short. Children with eczema may need to wear soft gloves or mittens at night after applying an ointment.  Dress in clothes made of cotton or cotton blends. Dress lightly, because heat increases itching.  A child with eczema should stay away from anyone with fever blisters or cold sores. The virus that causes fever blisters (herpes simplex) can cause a serious skin infection in children with eczema. Contact a health care provider if:  Your itching interferes with sleep.  Your rash gets worse or is not better within 1 week after starting treatment.  You see pus or soft yellow scabs in the rash area.  You have a fever.  You have a rash flare-up after contact with someone who has fever blisters. This information is  not intended to replace advice given to you by your health care provider. Make sure you discuss any questions you have with your health care provider. Document Released: 06/14/2000 Document Revised: 11/23/2015 Document Reviewed: 01/18/2013 Elsevier Interactive Patient Education  2017 Elsevier Inc.   Please be sure medication list is  accurate. If a new problem present, please set up appointment sooner than planned today.

## 2017-06-10 ENCOUNTER — Ambulatory Visit: Payer: Managed Care, Other (non HMO) | Admitting: Family Medicine

## 2017-06-23 ENCOUNTER — Ambulatory Visit: Payer: Managed Care, Other (non HMO) | Admitting: Family Medicine

## 2017-06-23 ENCOUNTER — Encounter: Payer: Self-pay | Admitting: Family Medicine

## 2017-06-23 VITALS — BP 116/72 | HR 69 | Temp 97.9°F | Resp 12 | Ht 63.0 in | Wt 121.5 lb

## 2017-06-23 DIAGNOSIS — L209 Atopic dermatitis, unspecified: Secondary | ICD-10-CM

## 2017-06-23 NOTE — Progress Notes (Signed)
HPI:   [name] is a 61 y.o. male, who is here today to follow on recent OV.   He was seen on 05/20/17, when he was complaining about persistent erythematosus rash on left > right pretibial area with clear exudate. He is taking Prednisone and he has also been treated with antibiotics.  I recommended topical Clobetasol as well as Domeboro. He did not use the latter one because pharmacy did not have it and it took a few days to fill it. He started Clobetasol as instructed and rash resolved in a few days. He did not noted side effects from topical steroid. Last time she used topical clobetasol was a week ago and rash has not reoccurred.  He does not have any other concern today.    Review of Systems  Constitutional: Negative for appetite change, fatigue and fever.  HENT: Negative for mouth sores and sore throat.   Respiratory: Negative for shortness of breath and wheezing.   Gastrointestinal: Negative for abdominal pain, nausea and vomiting.  Musculoskeletal: Negative for joint swelling and myalgias.  Skin: Negative for rash and wound.  Hematological: Negative for adenopathy. Does not bruise/bleed easily.      Current Outpatient Medications on File Prior to Visit  Medication Sig Dispense Refill  . aluminum sulfate-calcium acetate (DOMEBORO) packet Apply 1 packet 3 (three) times daily as needed topically. 100 each 2  . clobetasol cream (TEMOVATE) 0.05 % Apply 1 application 2 (two) times daily topically. 14 days 45 g 0   No current facility-administered medications on file prior to visit.      Past Medical History:  Diagnosis Date  . History of pyelonephritis    No Known Allergies  Social History   Socioeconomic History  . Marital status: Married    Spouse name: None  . Number of children: 3  . Years of education: None  . Highest education level: None  Social Needs  . Financial resource strain: None  . Food insecurity - worry: None  . Food  insecurity - inability: None  . Transportation needs - medical: None  . Transportation needs - non-medical: None  Occupational History  . Occupation: OceanographerMechanical engineer    Employer: [employer]  Tobacco Use  . Smoking status: Current Every Day Smoker    Packs/day: 0.75    Years: 48.00    Pack years: 36.00    Types: Cigarettes  . Smokeless tobacco: Current User  . Tobacco comment: Encouaged patient to quit. He is interested and he states that his smoking is a "pasttime"  Substance and Sexual Activity  . Alcohol use: No    Alcohol/week: 0.0 oz  . Drug use: No  . Sexual activity: None  Other Topics Concern  . 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  Physical Exam  Nursing note and vitals reviewed. Constitutional: He is oriented to person, place, and time. He appears well-developed and well-nourished. No distress.  HENT:  Head: Normocephalic and atraumatic.  Mouth/Throat: Mucous membranes are normal.  Eyes: Conjunctivae are normal.  Cardiovascular: Normal rate and regular rhythm.  Pulses:      Dorsalis pedis pulses are 2+ on the right side, and 2+ on the left side.  Respiratory: Effort normal. No respiratory distress.  Musculoskeletal: He exhibits no edema or tenderness.  No signs of synovitis.  Neurological: He is alert and oriented to person, place, and time. He has normal strength. Gait normal.  Skin: Skin is warm. No rash noted. No erythema.  Pretibial area (bilateral) with dryness and scaly areas with mild post inflammatory changes.There is no erythema,no edema,and no drainage.   Psychiatric: He has a normal mood and affect.  Well groomed, good eye contact.      ASSESSMENT AND  PLAN:   Kurt MoundRicardo was seen today for follow-up.  Diagnoses and all orders for this visit:  Atopic dermatitis, unspecified type    Rash resolved. Instructed to apply moisturizer as needed, Eucerin or Cetaphil recommended. He can continue Clobetasol as needed, side effects discussed.  He states that he has plenty of Clobetasol left and does not need to fill.    Jazell Rosenau G. [place], MD  [facility]. [place] office.

## 2017-06-25 ENCOUNTER — Encounter: Payer: Self-pay | Admitting: Family Medicine

## 2017-09-02 ENCOUNTER — Encounter: Payer: Self-pay | Admitting: Adult Health

## 2017-09-02 ENCOUNTER — Ambulatory Visit (INDEPENDENT_AMBULATORY_CARE_PROVIDER_SITE_OTHER): Payer: Managed Care, Other (non HMO) | Admitting: Adult Health

## 2017-09-02 VITALS — BP 104/68 | HR 58 | Temp 98.1°F | Resp 16 | Ht 64.0 in | Wt 118.6 lb

## 2017-09-02 DIAGNOSIS — Z114 Encounter for screening for human immunodeficiency virus [HIV]: Secondary | ICD-10-CM

## 2017-09-02 DIAGNOSIS — Z1159 Encounter for screening for other viral diseases: Secondary | ICD-10-CM

## 2017-09-02 DIAGNOSIS — E785 Hyperlipidemia, unspecified: Secondary | ICD-10-CM

## 2017-09-02 DIAGNOSIS — Z125 Encounter for screening for malignant neoplasm of prostate: Secondary | ICD-10-CM

## 2017-09-02 DIAGNOSIS — Z0000 Encounter for general adult medical examination without abnormal findings: Secondary | ICD-10-CM | POA: Diagnosis not present

## 2017-09-02 DIAGNOSIS — F17200 Nicotine dependence, unspecified, uncomplicated: Secondary | ICD-10-CM

## 2017-09-02 LAB — CBC WITH DIFFERENTIAL/PLATELET
BASOS ABS: 0.1 10*3/uL (ref 0.0–0.1)
Basophils Relative: 1.7 % (ref 0.0–3.0)
EOS PCT: 5.8 % — AB (ref 0.0–5.0)
Eosinophils Absolute: 0.3 10*3/uL (ref 0.0–0.7)
HEMATOCRIT: 45.2 % (ref 39.0–52.0)
HEMOGLOBIN: 14.9 g/dL (ref 13.0–17.0)
LYMPHS ABS: 1.7 10*3/uL (ref 0.7–4.0)
Lymphocytes Relative: 28.7 % (ref 12.0–46.0)
MCHC: 32.9 g/dL (ref 30.0–36.0)
MCV: 91.4 fl (ref 78.0–100.0)
MONOS PCT: 10.6 % (ref 3.0–12.0)
Monocytes Absolute: 0.6 10*3/uL (ref 0.1–1.0)
Neutro Abs: 3.2 10*3/uL (ref 1.4–7.7)
Neutrophils Relative %: 53.2 % (ref 43.0–77.0)
Platelets: 212 10*3/uL (ref 150.0–400.0)
RBC: 4.95 Mil/uL (ref 4.22–5.81)
RDW: 13 % (ref 11.5–15.5)
WBC: 6 10*3/uL (ref 4.0–10.5)

## 2017-09-02 LAB — LIPID PANEL
CHOL/HDL RATIO: 4
Cholesterol: 179 mg/dL (ref 0–200)
HDL: 48.3 mg/dL (ref >39.00)
LDL CALC: 112 mg/dL — AB (ref 0–99)
NonHDL: 130.54
Triglycerides: 95 mg/dL (ref 0.0–149.0)
VLDL: 19 mg/dL (ref 0.0–40.0)

## 2017-09-02 LAB — COMPREHENSIVE METABOLIC PANEL
ALK PHOS: 67 U/L (ref 39–117)
ALT: 16 U/L (ref 0–53)
AST: 16 U/L (ref 0–37)
Albumin: 4.1 g/dL (ref 3.5–5.2)
BILIRUBIN TOTAL: 0.7 mg/dL (ref 0.2–1.2)
BUN: 15 mg/dL (ref 6–23)
CALCIUM: 9.5 mg/dL (ref 8.4–10.5)
CO2: 29 mEq/L (ref 19–32)
Chloride: 105 mEq/L (ref 96–112)
Creatinine, Ser: 1.24 mg/dL (ref 0.40–1.50)
GFR: 63.11 mL/min (ref >60.00)
GLUCOSE: 82 mg/dL (ref 70–99)
POTASSIUM: 4.2 meq/L (ref 3.5–5.1)
Sodium: 142 mEq/L (ref 135–145)
TOTAL PROTEIN: 6.5 g/dL (ref 6.0–8.3)

## 2017-09-02 LAB — TSH: TSH: 1.06 u[IU]/mL (ref 0.35–4.50)

## 2017-09-02 LAB — PSA: PSA: 0.63 ng/mL (ref 0.10–4.00)

## 2017-09-02 NOTE — Progress Notes (Signed)
Subjective:    Patient ID: Kurt Duncan, male    DOB: 1956/02/25, 62 y.o.   MRN: [id number]  HPI  Patient presents for yearly preventative medicine examination. He is a pleasant 62 year old male who  has a past medical history of History of pyelonephritis.   He continues to smoke about half a pack daily.   All immunizations and health maintenance protocols were reviewed with the patient and needed orders were placed.  Appropriate screening laboratory values were ordered for the patient including screening of hyperlipidemia, renal function and hepatic function. If indicated by BPH, a PSA was ordered.  Medication reconciliation,  past medical history, social history, problem list and allergies were reviewed in detail with the patient  Goals were established with regard to weight loss, exercise, and  diet in compliance with medications. He tries to eat a heart healthy diet and prefers vegetables to meat. He does not exercise on a regular basis.   End of life planning was discussed.  He is up to date on his colonoscopy, he has follow up in 2021. He is seen by his dentist every 6 months.    Review of Systems  Constitutional: Negative.   HENT: Negative.   Eyes: Negative.   Respiratory: Negative.   Cardiovascular: Negative.   Gastrointestinal: Negative.   Endocrine: Negative.   Genitourinary: Negative.   Musculoskeletal: Negative.   Skin: Negative.   Allergic/Immunologic: Negative.   Neurological: Negative.   Hematological: Negative.   Psychiatric/Behavioral: Negative.   All other systems reviewed and are negative.  Past Medical History:  Diagnosis Date  . History of pyelonephritis     Social History   Socioeconomic History  . Marital status: Married    Spouse name: Not on file  . Number of children: 3  . Years of education: Not on file  . Highest education level: Not on file  Social Needs  . Financial resource strain: Not on file  . Food insecurity - worry: Not  on file  . Food insecurity - inability: Not on file  . Transportation needs - medical: Not on file  . Transportation needs - non-medical: Not on file  Occupational History  . Occupation: Oceanographer: Hospital doctor  Tobacco Use  . Smoking status: Current Every Day Smoker    Packs/day: 0.50    Years: 48.00    Pack years: 24.00    Types: Cigarettes  . Smokeless tobacco: Current User  . Tobacco comment: Encouaged patient to quit. He is interested and he states that his smoking is a "pasttime"  Substance and Sexual Activity  . Alcohol use: No    Alcohol/week: 0.0 oz  . Drug use: No  . Sexual activity: Yes  Other Topics Concern  . 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 COLONOSCOPY  2012   Normal    Family History  Problem Relation Age of Onset  . Hypertension Mother   . Cancer Father 45       brain    No Known Allergies  Current Outpatient Medications on File Prior to Visit  Medication Sig Dispense Refill  . clobetasol cream (TEMOVATE) 0.05 % Apply 1 application 2 (two) times daily  topically. 14 days 45 g 0  . aluminum sulfate-calcium acetate (DOMEBORO) packet Apply 1 packet 3 (three) times daily as needed topically. (Patient not taking: Reported on 09/02/2017) 100 each 2   No current facility-administered medications on file prior to visit.     BP 104/68 (BP Location: Left Arm, Patient Position: Sitting, Cuff Size: Normal)   Pulse (!) 58   Temp 98.1 F (36.7 C) (Oral)   Resp 16   Ht 5\' 4"  (1.626 m)   Wt 118 lb 9.6 oz (53.8 kg)   SpO2 93%   BMI 20.36 kg/m       Objective:   Physical Exam  Constitutional: He is oriented to person, place, and time. He appears well-developed and well-nourished. No distress.  HENT:  Head:  Normocephalic and atraumatic.  Right Ear: External ear normal.  Left Ear: External ear normal.  Nose: Nose normal.  Mouth/Throat: Oropharynx is clear and moist. No oropharyngeal exudate.  Eyes: Conjunctivae and EOM are normal. Pupils are equal, round, and reactive to light. Right eye exhibits no discharge. Left eye exhibits no discharge. No scleral icterus.  Neck: Normal range of motion. Neck supple. No JVD present. No tracheal deviation present. No thyromegaly present.  Cardiovascular: Normal rate, regular rhythm, normal heart sounds and intact distal pulses. Exam reveals no gallop and no friction rub.  No murmur heard. Pulmonary/Chest: Effort normal and breath sounds normal. No stridor. No respiratory distress. He has no wheezes. He has no rales. He exhibits no tenderness.  Abdominal: Soft. Bowel sounds are normal. He exhibits no distension and no mass. There is no tenderness. There is no rebound and no guarding.  Genitourinary:  Genitourinary Comments: Deferred: Will do PSA   Musculoskeletal: Normal range of motion. He exhibits no edema, tenderness or deformity.  Lymphadenopathy:    He has no cervical adenopathy.  Neurological: He is alert and oriented to person, place, and time. He has normal reflexes. He displays normal reflexes. No cranial nerve deficit. He exhibits normal muscle tone. Coordination normal.  Skin: Skin is warm and dry. No rash noted. He is not diaphoretic. No erythema. No pallor.  Eczema patch on left lower leg   Psychiatric: He has a normal mood and affect. His behavior is normal. Judgment and thought content normal.      Assessment & Plan:  1. Preventative health care - Encouraged to quit smoking.  - Encouraged more routine exercise  - Follow up in on year or sooner if needed - CBC with Differential/Platelet - Comprehensive metabolic panel - Lipid panel - TSH  2. Hyperlipidemia, unspecified hyperlipidemia type - consider statin  - CBC with  Differential/Platelet - Comprehensive metabolic panel - Lipid panel - TSH  3. Tobacco use disorder -Tobacco Cessation- I have discussed the risks of tobacco use with the patient, which includes but are not limited to lung, throat,stomach, pancreatic, and colon cancer,as well as COPD. The type and daily amount of tobacco used, as well as the patients willingness to quit were accessed. I discussed with the patient the different medical interventions, their usefulness, effectiveness and side effects. Pt declines medical intervention at this time. Will continue to discuss in further visits and patient encouraged to return if they need assistance with cessation in the future.    4. Prostate cancer screening  - PSA  5. Need for hepatitis C screening test  - Hep C Antibody  6. Encounter for screening for HIV  - HIV antibody   Shirline Freesory Kikuye Korenek, NP

## 2017-09-02 NOTE — Patient Instructions (Signed)
It was great seeing you today   Please work on cutting back on the amount of cigarettes you smoke   I will follow up up with you about your blood work   Please follow up with me in one year or sooner if needed

## 2017-09-03 LAB — HEPATITIS C ANTIBODY
Hepatitis C Ab: NONREACTIVE
SIGNAL TO CUT-OFF: 0.01 (ref <1.00)

## 2017-09-03 LAB — HIV ANTIBODY (ROUTINE TESTING W REFLEX): HIV: NONREACTIVE

## 2017-10-14 ENCOUNTER — Ambulatory Visit (INDEPENDENT_AMBULATORY_CARE_PROVIDER_SITE_OTHER)
Admission: RE | Admit: 2017-10-14 | Discharge: 2017-10-14 | Disposition: A | Discharge status: Home / Self Care | Payer: Managed Care, Other (non HMO) | Source: Ambulatory Visit | Attending: Acute Care | Admitting: Acute Care

## 2017-10-14 DIAGNOSIS — F17210 Nicotine dependence, cigarettes, uncomplicated: Secondary | ICD-10-CM

## 2017-10-23 ENCOUNTER — Other Ambulatory Visit: Payer: Self-pay | Admitting: Acute Care

## 2017-10-23 DIAGNOSIS — F17210 Nicotine dependence, cigarettes, uncomplicated: Principal | ICD-10-CM

## 2017-10-23 DIAGNOSIS — Z122 Encounter for screening for malignant neoplasm of respiratory organs: Secondary | ICD-10-CM

## 2018-07-21 ENCOUNTER — Ambulatory Visit: Payer: Managed Care, Other (non HMO) | Admitting: Family Medicine

## 2018-07-21 ENCOUNTER — Encounter: Payer: Self-pay | Admitting: Family Medicine

## 2018-07-21 VITALS — BP 140/92 | HR 82 | Temp 98.1°F | Wt 120.5 lb

## 2018-07-21 DIAGNOSIS — J019 Acute sinusitis, unspecified: Secondary | ICD-10-CM | POA: Diagnosis not present

## 2018-07-21 MED ORDER — AZITHROMYCIN 250 MG PO TABS: ORAL_TABLET | ORAL | 0 refills | Status: DC

## 2018-07-21 NOTE — Progress Notes (Signed)
   Subjective:    Patient ID: Treyquan Lambertson, male    DOB: 1955/11/01, 63 y.o.   MRN: [id number]  HPI Here for 10 days of sinus pressure, PND, and coughing up yellow sputum. No fever. Taking Mucinex and using nasal rinses.   Review of Systems  Constitutional: Negative.   HENT: Positive for congestion, postnasal drip, sinus pressure and sinus pain. Negative for sore throat.   Eyes: Negative.   Respiratory: Positive for cough.        Objective:   Physical Exam Constitutional:      Appearance: Normal appearance.  HENT:     Right Ear: Tympanic membrane and ear canal normal.     Left Ear: Tympanic membrane and ear canal normal.     Nose: Nose normal.     Mouth/Throat:     Pharynx: Oropharynx is clear.  Eyes:     Conjunctiva/sclera: Conjunctivae normal.  Pulmonary:     Effort: Pulmonary effort is normal.     Breath sounds: Normal breath sounds.  Lymphadenopathy:     Cervical: No cervical adenopathy.  Neurological:     Mental Status: He is alert.           Assessment & Plan:  Sinusitis, treat with a Zpack.  Gershon Crane, MD

## 2018-09-08 ENCOUNTER — Ambulatory Visit (INDEPENDENT_AMBULATORY_CARE_PROVIDER_SITE_OTHER): Payer: Managed Care, Other (non HMO) | Admitting: Adult Health

## 2018-09-08 ENCOUNTER — Encounter: Payer: Self-pay | Admitting: Adult Health

## 2018-09-08 ENCOUNTER — Other Ambulatory Visit: Payer: Self-pay

## 2018-09-08 VITALS — BP 122/98 | HR 56 | Temp 97.7°F | Ht 64.0 in | Wt 117.4 lb

## 2018-09-08 DIAGNOSIS — F17200 Nicotine dependence, unspecified, uncomplicated: Secondary | ICD-10-CM

## 2018-09-08 DIAGNOSIS — Z125 Encounter for screening for malignant neoplasm of prostate: Secondary | ICD-10-CM | POA: Diagnosis not present

## 2018-09-08 DIAGNOSIS — Z0000 Encounter for general adult medical examination without abnormal findings: Secondary | ICD-10-CM

## 2018-09-08 DIAGNOSIS — E785 Hyperlipidemia, unspecified: Secondary | ICD-10-CM

## 2018-09-08 DIAGNOSIS — I2510 Atherosclerotic heart disease of native coronary artery without angina pectoris: Secondary | ICD-10-CM

## 2018-09-08 LAB — CBC WITH DIFFERENTIAL/PLATELET
Basophils Absolute: 0.1 10*3/uL (ref 0.0–0.1)
Basophils Relative: 1.9 % (ref 0.0–3.0)
EOS ABS: 0.4 10*3/uL (ref 0.0–0.7)
EOS PCT: 5.4 % — AB (ref 0.0–5.0)
HEMATOCRIT: 46.8 % (ref 39.0–52.0)
HEMOGLOBIN: 15.4 g/dL (ref 13.0–17.0)
LYMPHS PCT: 31.3 % (ref 12.0–46.0)
Lymphs Abs: 2.4 10*3/uL (ref 0.7–4.0)
MCHC: 33 g/dL (ref 30.0–36.0)
MCV: 91.1 fl (ref 78.0–100.0)
MONO ABS: 0.8 10*3/uL (ref 0.1–1.0)
Monocytes Relative: 10.4 % (ref 3.0–12.0)
Neutro Abs: 3.8 10*3/uL (ref 1.4–7.7)
Neutrophils Relative %: 51 % (ref 43.0–77.0)
Platelets: 217 10*3/uL (ref 150.0–400.0)
RBC: 5.14 Mil/uL (ref 4.22–5.81)
RDW: 13.2 % (ref 11.5–15.5)
WBC: 7.6 10*3/uL (ref 4.0–10.5)

## 2018-09-08 LAB — COMPREHENSIVE METABOLIC PANEL
ALBUMIN: 4.4 g/dL (ref 3.5–5.2)
ALK PHOS: 72 U/L (ref 39–117)
ALT: 17 U/L (ref 0–53)
AST: 19 U/L (ref 0–37)
BUN: 21 mg/dL (ref 6–23)
CO2: 30 mEq/L (ref 19–32)
CREATININE: 1.48 mg/dL (ref 0.40–1.50)
Calcium: 9.5 mg/dL (ref 8.4–10.5)
Chloride: 103 mEq/L (ref 96–112)
GFR: 48.25 mL/min — ABNORMAL LOW (ref >60.00)
Glucose, Bld: 72 mg/dL (ref 70–99)
Potassium: 4.2 mEq/L (ref 3.5–5.1)
SODIUM: 141 meq/L (ref 135–145)
TOTAL PROTEIN: 6.9 g/dL (ref 6.0–8.3)
Total Bilirubin: 0.8 mg/dL (ref 0.2–1.2)

## 2018-09-08 LAB — LIPID PANEL
CHOLESTEROL: 183 mg/dL (ref 0–200)
HDL: 49.1 mg/dL (ref >39.00)
LDL CALC: 104 mg/dL — AB (ref 0–99)
NonHDL: 133.53
Total CHOL/HDL Ratio: 4
Triglycerides: 148 mg/dL (ref 0.0–149.0)
VLDL: 29.6 mg/dL (ref 0.0–40.0)

## 2018-09-08 LAB — TSH: TSH: 1.56 u[IU]/mL (ref 0.35–4.50)

## 2018-09-08 LAB — PSA: PSA: 0.57 ng/mL (ref 0.10–4.00)

## 2018-09-08 MED ORDER — ATORVASTATIN CALCIUM 40 MG PO TABS: 40.0000 mg | ORAL_TABLET | Freq: Every day | ORAL | 3 refills | Status: DC

## 2018-09-08 NOTE — Patient Instructions (Signed)
It was great seeing you today   Please quit smoking   Someone from Cardiology will call you to schedule your exam with them to make sure your coronary blood vessels are ok   We will follow up with you regarding labs

## 2018-09-08 NOTE — Progress Notes (Signed)
Subjective:    Patient ID: Kurt Duncan, male    DOB: [birth year], 63 y.o.   MRN: [id number]  HPI  Patient presents for yearly preventative medicine examination. Pleasant 63 year old male who  has a past medical history of History of pyelonephritis.  Tobacco Use - continues to smoke about 1/2 pack per day. Had low dose CT of Lungs on 09/2017 which showed   1. Lung-RADS 2S, benign appearance or behavior. Continue annual screening with low-dose chest CT without contrast in 12 months. 2. The "S" modifier above refers to potentially clinically significant non lung cancer related findings. Specifically, Aortic atherosclerosis, in addition to left main and 2 vessel coronary artery disease. Please note that although the presence of coronary artery calcium documents the presence of coronary artery disease, the severity of this disease and any potential stenosis cannot be assessed on this non-gated CT examination. Assessment for potential risk factor modification, dietary therapy or pharmacologic therapy may be warranted, if clinically indicated. 3. Mild diffuse bronchial wall thickening with very mild centrilobular and paraseptal emphysema; imaging findings suggestive of underlying COPD. 4. 3 mm nonobstructive calculus in the upper pole collecting system of left kidney.  He has not quit smoking and does not want any medications to do so. He reports that he is working on cutting back.   Hyperlipidemia/CAD - not currently prescribed medication. Denies chest pain or shortness of breath. Low Dose CT showed 2 vessels disease  Lab Results  Component Value Date   CHOL 179 09/02/2017   HDL 48.30 09/02/2017   LDLCALC 112 (H) 09/02/2017   LDLDIRECT 151.3 08/02/2013   TRIG 95.0 09/02/2017   CHOLHDL 4 09/02/2017    All immunizations and health maintenance protocols were reviewed with the patient and needed orders were placed. UTD.   Appropriate screening laboratory values were ordered for  the patient including screening of hyperlipidemia, renal function and hepatic function. If indicated by BPH, a PSA was ordered.  Medication reconciliation,  past medical history, social history, problem list and allergies were reviewed in detail with the patient  Goals were established with regard to weight loss, exercise, and  diet in compliance with medications Wt Readings from Last 3 Encounters:  09/08/18 117 lb 6.4 oz (53.3 kg)  07/21/18 120 lb 8 oz (54.7 kg)  09/02/17 118 lb 9.6 oz (53.8 kg)   End of life planning was discussed.  He is up to date on screening colonoscopy. He is seen by the dentist every 6 months.   Review of Systems  Constitutional: Negative.   HENT: Negative.   Eyes: Negative.   Respiratory: Negative.   Cardiovascular: Negative.   Gastrointestinal: Negative.   Endocrine: Negative.   Genitourinary: Negative.   Musculoskeletal: Negative.   Skin: Negative.   Allergic/Immunologic: Negative.   Neurological: Negative.   Hematological: Negative.   Psychiatric/Behavioral: Negative.   All other systems reviewed and are negative.  Past Medical History:  Diagnosis Date  . History of pyelonephritis     Social History   Socioeconomic History  . Marital status: Married    Spouse name: Not on file  . Number of children: 3  . Years of education: Not on file  . Highest education level: Not on file  Occupational History  . Occupation: Oceanographer: Hospital doctor  Social Needs  . Financial resource strain: Not on file  . Food insecurity:    Worry: Not on file    Inability: Not on file  .  Transportation needs:    Medical: Not on file    Non-medical: Not on file  Tobacco Use  . Smoking status: Current Every Day Smoker    Packs/day: 0.50    Years: 48.00    Pack years: 24.00    Types: Cigarettes  . Smokeless tobacco: Current User  . Tobacco comment: Encouaged patient to quit. He is interested and he states that his smoking is a  "pasttime"  Substance and Sexual Activity  . Alcohol use: No    Alcohol/week: 0.0 standard drinks  . Drug use: No  . Sexual activity: Yes  Lifestyle  . Physical activity:    Days per week: Not on file    Minutes per session: Not on file  . Stress: Not on file  Relationships  . Social connections:    Talks on phone: Not on file    Gets together: Not on file    Attends religious service: Not on file    Active member of club or organization: Not on file    Attends meetings of clubs or organizations: Not on file    Relationship status: Not on file  . Intimate partner violence:    Fear of current or ex partner: Not on file    Emotionally abused: Not on file    Physically abused: Not on file    Forced sexual activity: Not on file  Other Topics Concern  . 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 COLONOSCOPY  2012   Normal    Family History  Problem Relation Age of Onset  . Hypertension Mother   . Cancer Father 45       brain    No Known Allergies  Current Outpatient Medications on File Prior to Visit  Medication Sig Dispense Refill  . aluminum sulfate-calcium acetate (DOMEBORO) packet Apply 1 packet 3 (three) times daily as needed topically. 100 each 2   No current facility-administered medications on file prior to visit.     BP (!) 122/98 (BP Location: Left Arm, Patient Position: Sitting, Cuff Size: Normal)   Pulse (!) 56   Temp 97.7 F (36.5 C) (Oral)   Ht  (1.626 m)   Wt 117 lb 6.4 oz (53.3 kg)   SpO2 99%   BMI 20.15 kg/m       Objective:   Physical Exam Vitals signs and nursing note reviewed.  Constitutional:      General: He is not in acute distress.    Appearance: Normal appearance. He is well-developed and normal weight. He is not  toxic-appearing or diaphoretic.  HENT:     Head: Normocephalic and atraumatic.     Right Ear: Tympanic membrane, ear canal and external ear normal. There is no impacted cerumen.     Left Ear: Tympanic membrane, ear canal and external ear normal. There is no impacted cerumen.     Nose: Nose normal. No congestion or rhinorrhea.     Mouth/Throat:     Mouth: Mucous membranes are moist.     Pharynx: Oropharynx is clear. No oropharyngeal exudate.  Eyes:     General: No scleral icterus.       Right eye: No discharge.  Left eye: No discharge.     Extraocular Movements: Extraocular movements intact.     Conjunctiva/sclera: Conjunctivae normal.     Pupils: Pupils are equal, round, and reactive to light.  Neck:     Musculoskeletal: Normal range of motion and neck supple.     Thyroid: No thyromegaly.     Trachea: No tracheal deviation.  Cardiovascular:     Rate and Rhythm: Normal rate and regular rhythm.     Pulses: Normal pulses.     Heart sounds: Normal heart sounds. No murmur. No friction rub. No gallop.   Pulmonary:     Effort: Pulmonary effort is normal. No respiratory distress.     Breath sounds: Normal breath sounds. No stridor. No wheezing, rhonchi or rales.  Chest:     Chest wall: No tenderness.  Abdominal:     General: Bowel sounds are normal. There is no distension.     Palpations: Abdomen is soft. There is no mass.     Tenderness: There is no abdominal tenderness. There is no right CVA tenderness, left CVA tenderness, guarding or rebound.     Hernia: No hernia is present.  Musculoskeletal: Normal range of motion.        General: No swelling, tenderness, deformity or signs of injury.     Right lower leg: No edema.     Left lower leg: No edema.  Lymphadenopathy:     Cervical: No cervical adenopathy.  Skin:    General: Skin is warm and dry.     Coloration: Skin is not jaundiced or pale.     Findings: No bruising, erythema, lesion or rash.  Neurological:     General: No  focal deficit present.     Mental Status: He is alert and oriented to person, place, and time. Mental status is at baseline.     Cranial Nerves: No cranial nerve deficit.     Sensory: No sensory deficit.     Motor: No weakness.     Coordination: Coordination normal.     Gait: Gait normal.     Deep Tendon Reflexes: Reflexes normal.  Psychiatric:        Mood and Affect: Mood normal.        Behavior: Behavior normal.        Thought Content: Thought content normal.        Judgment: Judgment normal.       Assessment & Plan:  1. Routine general medical examination at a health care facility - Needs to quit smoking and start exercising.  - Follow up in one year or sooner if needed - CBC with Differential/Platelet - Comprehensive metabolic panel - Lipid panel - TSH - PSA  2. Hyperlipidemia, unspecified hyperlipidemia type - Will prescribe lipitor 40 mg  - CBC with Differential/Platelet - Comprehensive metabolic panel - Lipid panel - TSH - PSA  3. Tobacco use disorder - Encouraged to quit smoking   4. Prostate cancer screening  - CBC with Differential/Platelet - Comprehensive metabolic panel - Lipid panel - TSH - PSA  5. Coronary artery disease involving native heart without angina pectoris, unspecified vessel or lesion type  - Ambulatory referral to Cardiology - atorvastatin (LIPITOR) 40 MG tablet; Take 1 tablet (40 mg total) by mouth daily.  Dispense: 90 tablet; Refill: 3  Shirline Frees, NP

## 2018-09-22 ENCOUNTER — Telehealth: Payer: Self-pay | Admitting: Cardiology

## 2018-09-22 ENCOUNTER — Ambulatory Visit: Payer: Managed Care, Other (non HMO) | Admitting: Cardiology

## 2018-09-22 NOTE — Telephone Encounter (Signed)
   Primary Cardiologist:  Bryan Lemma, MD   Patient contacted.  History reviewed.  No symptoms to suggest any unstable cardiac conditions.  Based on discussion, with current pandemic situation, we will be postponing this appointment for Marquita [place]ms with a plan for f/u in  4 wks or sooner if feasible/necessary.  If symptoms change, he has been instructed to contact our office.   Routing to C19 CANCEL pool for tracking Loleta Chance  09/22/2018 12:57 PM         .

## 2018-09-22 NOTE — Telephone Encounter (Signed)
I contacted the patient personally on telephone to discuss his upcoming visit.  I reviewed his chart which indicated that he has been referred for coronary artery calcification noted on CT scan.  The patient himself indicated that he is not having any symptoms of chest tightness/pressure or dyspnea on exertion.  This very this would be a low risk evaluation which I think could be delayed based on our current restrictions with Covid-19.  I discussed with him that are likely initial evaluation would be a coronary calcium score which can be ordered once our coronary CT scan which would not be until at least April 20.  As such, we decided that we would contact him in roughly 1 month timeframe to discuss either pre-scheduling coronary calcium score prior to visit versus seeing him to discuss options.   Bryan Lemma, MD

## 2018-10-08 ENCOUNTER — Other Ambulatory Visit: Payer: Self-pay | Admitting: Adult Health

## 2018-10-08 DIAGNOSIS — N183 Chronic kidney disease, stage 3 unspecified: Secondary | ICD-10-CM

## 2018-10-14 ENCOUNTER — Other Ambulatory Visit (INDEPENDENT_AMBULATORY_CARE_PROVIDER_SITE_OTHER): Payer: Managed Care, Other (non HMO)

## 2018-10-14 ENCOUNTER — Other Ambulatory Visit: Payer: Self-pay

## 2018-10-14 DIAGNOSIS — N183 Chronic kidney disease, stage 3 unspecified: Secondary | ICD-10-CM

## 2018-10-14 LAB — BASIC METABOLIC PANEL
BUN: 19 mg/dL (ref 6–23)
CO2: 29 mEq/L (ref 19–32)
Calcium: 9.5 mg/dL (ref 8.4–10.5)
Chloride: 104 mEq/L (ref 96–112)
Creatinine, Ser: 1.47 mg/dL (ref 0.40–1.50)
GFR: 48.61 mL/min — ABNORMAL LOW (ref >60.00)
Glucose, Bld: 79 mg/dL (ref 70–99)
Potassium: 4.3 mEq/L (ref 3.5–5.1)
Sodium: 141 mEq/L (ref 135–145)

## 2018-10-15 ENCOUNTER — Other Ambulatory Visit: Payer: Self-pay | Admitting: Adult Health

## 2018-10-15 DIAGNOSIS — N289 Disorder of kidney and ureter, unspecified: Secondary | ICD-10-CM

## 2018-11-16 ENCOUNTER — Other Ambulatory Visit: Payer: Self-pay | Admitting: Nephrology

## 2018-11-16 DIAGNOSIS — N183 Chronic kidney disease, stage 3 unspecified: Secondary | ICD-10-CM

## 2018-11-26 ENCOUNTER — Other Ambulatory Visit: Payer: Self-pay

## 2018-11-26 ENCOUNTER — Ambulatory Visit
Admission: RE | Admit: 2018-11-26 | Discharge: 2018-11-26 | Disposition: A | Discharge status: Home / Self Care | Payer: Managed Care, Other (non HMO) | Source: Ambulatory Visit | Attending: Nephrology | Admitting: Nephrology

## 2018-11-26 DIAGNOSIS — N183 Chronic kidney disease, stage 3 unspecified: Secondary | ICD-10-CM

## 2018-12-04 ENCOUNTER — Other Ambulatory Visit: Payer: Self-pay | Admitting: Acute Care

## 2018-12-04 DIAGNOSIS — Z122 Encounter for screening for malignant neoplasm of respiratory organs: Secondary | ICD-10-CM

## 2018-12-04 DIAGNOSIS — F17210 Nicotine dependence, cigarettes, uncomplicated: Secondary | ICD-10-CM

## 2019-01-05 ENCOUNTER — Telehealth: Payer: Managed Care, Other (non HMO) | Admitting: Cardiology

## 2019-01-14 ENCOUNTER — Other Ambulatory Visit: Payer: Self-pay

## 2019-01-14 ENCOUNTER — Encounter: Payer: Self-pay | Admitting: Cardiology

## 2019-01-14 ENCOUNTER — Ambulatory Visit: Payer: Managed Care, Other (non HMO) | Admitting: Cardiology

## 2019-01-14 ENCOUNTER — Telehealth: Payer: Self-pay | Admitting: Cardiology

## 2019-01-14 DIAGNOSIS — R931 Abnormal findings on diagnostic imaging of heart and coronary circulation: Secondary | ICD-10-CM | POA: Insufficient documentation

## 2019-01-14 DIAGNOSIS — E785 Hyperlipidemia, unspecified: Secondary | ICD-10-CM

## 2019-01-14 DIAGNOSIS — F17200 Nicotine dependence, unspecified, uncomplicated: Secondary | ICD-10-CM | POA: Diagnosis not present

## 2019-01-14 DIAGNOSIS — I2510 Atherosclerotic heart disease of native coronary artery without angina pectoris: Secondary | ICD-10-CM | POA: Diagnosis not present

## 2019-01-14 NOTE — Telephone Encounter (Signed)
I called pt to confirm his appt for 01-14-19 with Dr Ellyn Hack.       COVID-19 Pre-Screening Questions:   In the past 7 to 10 days have you had a cough,  shortness of breath, headache, congestion, fever (100 or greater) body aches, chills, sore throat, or sudden loss of taste or sense of smell? no  Have you been around anyone with known Covid 19.  Have you been around anyone who is awaiting Covid 19 test results in the past 7 to 10 days? no  Have you been around anyone who has been exposed to Covid 19, or has mentioned symptoms of Covid 19 within the past 7 to 10 days? no  If you have any concerns/questions about symptoms patients report during screening (either on the phone or at threshold). Contact the provider seeing the patient or DOD for further guidance.  If neither are available contact a member of the leadership team.

## 2019-01-14 NOTE — Patient Instructions (Addendum)
Medication Instructions:  NO CHANGES   Lab work: NOT NEEDED   Testing/Procedures:  CT coronary calcium score. This test is done at [address]. This is $150 out of pocket.   Coronary CalciumScan A coronary calcium scan is an imaging test used to look for deposits of calcium and other fatty materials (plaques) in the inner lining of the blood vessels of the heart (coronary arteries). These deposits of calcium and plaques can partly clog and narrow the coronary arteries without producing any symptoms or warning signs. This puts a person at risk for a heart attack. This test can detect these deposits before symptoms develop. Tell a health care provider about:  Any allergies you have.  All medicines you are taking, including vitamins, herbs, eye drops, creams, and over-the-counter medicines.  Any problems you or family members have had with anesthetic medicines.  Any blood disorders you have.  Any surgeries you have had.  Any medical conditions you have.  Whether you are pregnant or may be pregnant. What are the risks? Generally, this is a safe procedure. However, problems may occur, including:  Harm to a pregnant woman and her unborn baby. This test involves the use of radiation. Radiation exposure can be dangerous to a pregnant woman and her unborn baby. If you are pregnant, you generally should not have this procedure done.  Slight increase in the risk of cancer. This is because of the radiation involved in the test. What happens before the procedure? No preparation is needed for this procedure. What happens during the procedure?  You will undress and remove any jewelry around your neck or chest.  You will put on a hospital gown.  Sticky electrodes will be placed on your chest. The electrodes will be connected to an electrocardiogram (ECG) machine to record a tracing of the electrical activity of your heart.  A CT scanner will take pictures of your  heart. During this time, you will be asked to lie still and hold your breath for 2-3 seconds while a picture of your heart is being taken. The procedure may vary among health care providers and hospitals. What happens after the procedure?  You can get dressed.  You can return to your normal activities.  It is up to you to get the results of your test. Ask your health care provider, or the department that is doing the test, when your results will be ready. Summary  A coronary calcium scan is an imaging test used to look for deposits of calcium and other fatty materials (plaques) in the inner lining of the blood vessels of the heart (coronary arteries).  Generally, this is a safe procedure. Tell your health care provider if you are pregnant or may be pregnant.  No preparation is needed for this procedure.  A CT scanner will take pictures of your heart.  You can return to your normal activities after the scan is done. This information is not intended to replace advice given to you by your health care provider. Make sure you discuss any questions you have with your health care provider. Document Released: 12/14/2007 Document Revised: 05/06/2016 Document Reviewed: 05/06/2016 Elsevier Interactive Patient Education  2017 [place]: At [facility], you and your health needs are our priority.  As part of our continuing mission to provide you with exceptional heart care, we have created designated Provider Care Teams.  These Care Teams include your primary Cardiologist (physician) and Advanced Practice Providers (APPs -  Physician Assistants and Nurse Practitioners) who all work together to provide you with the care you need, when you need it. . You will need a follow up appointment in 2 months.  Please call our office 2 months in advance to schedule this appointment.  You may see Bryan Lemmaavid Harding, MD or one of the following Advanced Practice Providers on your designated Care Team:    . Theodore DemarkRhonda Barrett, PA-C . Joni ReiningKathryn Lawrence, DNP, ANP  Any Other Special Instructions Will Be Listed Below (If Applicable).

## 2019-01-14 NOTE — Progress Notes (Signed)
PCP: Shirline FreesNafziger, Cory, NP  Clinic Note: Chief Complaint  Patient presents with  . New Patient (Initial Visit)  . Coronary Artery Disease    Coronary artery calcification seen on CT scan    HPI: Kurt Duncan is a 63 y.o. male who is being seen today for the evaluation of Coronary Atherosclerosis on CT Scan at the request of Shirline FreesNafziger, Cory, NP.  Kurt Duncan was last seen on 09/08/2018 by Shirline FreesNafziger, Cory, NP --> reviewed screening chest CT (lung cancer screening), noted to have coronary artery calcification.  Referred for cardiology evaluation.  Recent Hospitalizations: None  Studies Personally Reviewed - (if available, images/films reviewed: From Epic Chart or Care Everywhere)  Chest CT - Aortic Calcification & 2 V CAD.  Interval History: Kurt Duncan is a relatively healthy 63 year old gentleman with a 50+ year smoking history of about a 1/2-1 PPD but without other significant risk factors besides being male for CAD.  He is very active at work.  He walks and does yard work/cutting logs etc., but does not do routine exercise.  He denies any chest pain or pressure with rest or exertion.  No exertional dyspnea.  May be a little mild morning cough.  No PND, orthopnea or edema. No palpitations, lightheadedness, dizziness, weakness or syncope/near syncope. No TIA/amaurosis fugax symptoms. No claudication.  ROS: A comprehensive was performed. Review of Systems  Constitutional: Negative for malaise/fatigue.  HENT: Negative for congestion and nosebleeds.   Respiratory: Negative for cough, shortness of breath and wheezing.   Cardiovascular:       Per HPI  Gastrointestinal: Negative for blood in stool, heartburn, melena and nausea.  Genitourinary: Negative for hematuria.  Musculoskeletal: Negative for joint pain.  Neurological: Negative for dizziness, focal weakness, weakness and headaches.  Psychiatric/Behavioral: Negative for memory loss. The patient is not nervous/anxious and does not  have insomnia.   All other systems reviewed and are negative.  I have reviewed and (if needed) personally updated the patient's problem list, medications, allergies, past medical and surgical history, social and family history.   Past Medical History:  Diagnosis Date  . Current smoker    One half PPD x50 years  . History of pyelonephritis   . Hyperlipidemia     Past Surgical History:  Procedure Laterality Date  . COLONOSCOPY  2012   Normal    Current Meds  Medication Sig  . aluminum sulfate-calcium acetate (DOMEBORO) packet Apply 1 packet 3 (three) times daily as needed topically.  Marland Kitchen. atorvastatin (LIPITOR) 40 MG tablet Take 1 tablet (40 mg total) by mouth daily.    No Known Allergies  Social History   Tobacco Use  . Smoking status: Current Every Day Smoker    Packs/day: 0.50    Years: 48.00    Pack years: 24.00    Types: Cigarettes  . Smokeless tobacco: Current User  . Tobacco comment: Encouaged patient to quit. He is interested and he states that his smoking is a "pasttime"  Substance Use Topics  . Alcohol use: No    Alcohol/week: 0.0 standard drinks  . Drug use: No   Social History   Social History Narrative   Works as a Careers adviserengineer   Likes to "sleep" and rest because he is a Development worker, international aid"workaholic"   Computer and watch TV   Married 30 years   3 daughters ages 7029, 5826, 6917    family history includes Cancer (age of onset: 4245) in his father; Hypertension in his mother.  Wt Readings from Last 3 Encounters:  01/14/19 124 lb 6.4 oz (56.4 kg)  09/08/18 117 lb 6.4 oz (53.3 kg)  07/21/18 120 lb 8 oz (54.7 kg)    PHYSICAL EXAM BP 126/77   Pulse 69   Temp 98.1 F (36.7 C)   Ht 5\' 4"  (1.626 m)   Wt 124 lb 6.4 oz (56.4 kg)   SpO2 98%   BMI 21.35 kg/m  Physical Exam  Constitutional: He is oriented to person, place, and time. He appears well-developed and well-nourished. No distress.  Healthy-appearing gentleman.  He looks younger than stated age.  HENT:  Head:  Normocephalic and atraumatic.  Mouth/Throat: Oropharynx is clear and moist. No oropharyngeal exudate.  Eyes: Pupils are equal, round, and reactive to light. Conjunctivae and EOM are normal. No scleral icterus.  Neck: Normal range of motion. Neck supple. No hepatojugular reflux and no JVD present. Carotid bruit is not present.  Cardiovascular: Normal rate, regular rhythm, normal heart sounds and intact distal pulses.  No extrasystoles are present. PMI is not displaced. Exam reveals no gallop and no friction rub.  No murmur heard. Pulmonary/Chest: Effort normal and breath sounds normal. No respiratory distress. He has no wheezes. He has no rales.  Abdominal: Soft. Bowel sounds are normal. He exhibits no distension. There is no abdominal tenderness. There is no rebound.  Musculoskeletal: Normal range of motion.        General: No edema.  Neurological: He is alert and oriented to person, place, and time. No cranial nerve deficit.  Skin: Skin is warm and dry. No rash noted. No erythema.  Psychiatric: He has a normal mood and affect. His behavior is normal. Judgment and thought content normal.  Vitals reviewed.    Adult ECG Report  Rate: 69 ;  Rhythm: normal sinus rhythm and Minimal LVH.  Otherwise normal axis, intervals durations.;   Narrative Interpretation: Relatively normal EKG   Other studies Reviewed: Additional studies/ records that were reviewed today include:  Recent Labs: Lipid panel from September 08, 2018: TC 183, TG 148, HDL 49, LDL 104.  Hemoglobin 15.5.  CR 1.34.  K+ 4.3.  TSH 1.53.   ASSESSMENT / PLAN: Problem List Items Addressed This Visit    Tobacco use disorder (Chronic)    Smoking cessation instruction/counseling given:  counseled patient on the dangers of tobacco use, advised patient to stop smoking, and reviewed strategies to maximize success      Hyperlipidemia (Chronic)    LDL is 104.  Depending on his coronary calcium score looks like this may be adequate.  At this  point I think may be adding a baby aspirin to his standing dose of atorvastatin is warranted.      Coronary artery calcification seen on computed tomography (Chronic)    Asymptomatic from a cardiac standpoint.  Only real risk factor is smoking.  We talked at smoking cessation.  We discussed variable options of not evaluate this.  What I would like to do is quantify his coronary calcium with a coronary calcium score.  Depending on how this looks, if it is elevated, would proceed with coronary CTA, if not, would probably do a GXT (if not able to do GXT would do [employer])      Relevant Orders   EKG 12-Lead (Completed)   CT CARDIAC SCORING       I spent a total of 35 minutes with the patient and chart review. >  50% of the time was spent in direct patient consultation.   Current medicines are reviewed  at length with the patient today.  (+/- concerns) none The following changes have been made:  None  Patient Instructions  Medication Instructions:  NO CHANGES   Lab work: NOT NEEDED   Testing/Procedures:  CT coronary calcium score. This test is done [address]. This is $150 out of pocket.  Follow-Up: . You will need a follow up appointment in 2 months.  Please call our office 2 months in advance to schedule this appointment.  You may see Bryan Lemmaavid Keighley Deckman, MD or one of the following Advanced Practice Providers on your designated Care Team:   . Theodore DemarkRhonda Barrett, PA-C . Joni ReiningKathryn Lawrence, DNP, ANP  Any Other Special Instructions Will Be Listed Below (If Applicable).    Studies Ordered:   Orders Placed This Encounter  Procedures  . CT CARDIAC SCORING  . EKG 12-Lead      Bryan Lemmaavid Ruthia Person, M.D., M.S. Interventional Cardiologist   Pager # [phone] Phone # [phone] [address] [place], [place] [address]   Thank you for choosing [facility] at [facility]!!

## 2019-01-16 ENCOUNTER — Encounter: Payer: Self-pay | Admitting: Cardiology

## 2019-01-16 NOTE — Assessment & Plan Note (Signed)
Smoking cessation instruction/counseling given:  counseled patient on the dangers of tobacco use, advised patient to stop smoking, and reviewed strategies to maximize success 

## 2019-01-16 NOTE — Assessment & Plan Note (Signed)
LDL is 104.  Depending on his coronary calcium score looks like this may be adequate.  At this point I think may be adding a baby aspirin to his standing dose of atorvastatin is warranted.

## 2019-01-16 NOTE — Assessment & Plan Note (Signed)
Asymptomatic from a cardiac standpoint.  Only real risk factor is smoking.  We talked at smoking cessation.  We discussed variable options of not evaluate this.  What I would like to do is quantify his coronary calcium with a coronary calcium score.  Depending on how this looks, if it is elevated, would proceed with coronary CTA, if not, would probably do a GXT (if not able to do GXT would do [employer])

## 2019-01-21 ENCOUNTER — Ambulatory Visit (INDEPENDENT_AMBULATORY_CARE_PROVIDER_SITE_OTHER)
Admission: RE | Admit: 2019-01-21 | Discharge: 2019-01-21 | Disposition: A | Discharge status: Home / Self Care | Payer: Managed Care, Other (non HMO) | Source: Ambulatory Visit | Attending: Acute Care | Admitting: Acute Care

## 2019-01-21 ENCOUNTER — Other Ambulatory Visit: Payer: Self-pay

## 2019-01-21 ENCOUNTER — Telehealth: Payer: Self-pay | Admitting: *Deleted

## 2019-01-21 DIAGNOSIS — F17210 Nicotine dependence, cigarettes, uncomplicated: Secondary | ICD-10-CM | POA: Diagnosis not present

## 2019-01-21 DIAGNOSIS — Z122 Encounter for screening for malignant neoplasm of respiratory organs: Secondary | ICD-10-CM

## 2019-01-21 NOTE — Telephone Encounter (Signed)

## 2019-01-30 DIAGNOSIS — I2510 Atherosclerotic heart disease of native coronary artery without angina pectoris: Secondary | ICD-10-CM

## 2019-01-30 HISTORY — DX: Atherosclerotic heart disease of native coronary artery without angina pectoris: I25.10

## 2019-02-01 ENCOUNTER — Other Ambulatory Visit: Payer: Self-pay | Admitting: *Deleted

## 2019-02-01 DIAGNOSIS — F17210 Nicotine dependence, cigarettes, uncomplicated: Secondary | ICD-10-CM

## 2019-02-01 DIAGNOSIS — Z122 Encounter for screening for malignant neoplasm of respiratory organs: Secondary | ICD-10-CM

## 2019-02-22 ENCOUNTER — Other Ambulatory Visit: Payer: Self-pay

## 2019-02-22 ENCOUNTER — Ambulatory Visit (INDEPENDENT_AMBULATORY_CARE_PROVIDER_SITE_OTHER)
Admission: RE | Admit: 2019-02-22 | Discharge: 2019-02-22 | Disposition: A | Discharge status: Home / Self Care | Payer: Self-pay | Source: Ambulatory Visit | Attending: Cardiology | Admitting: Cardiology

## 2019-02-22 DIAGNOSIS — I2510 Atherosclerotic heart disease of native coronary artery without angina pectoris: Secondary | ICD-10-CM

## 2019-02-23 ENCOUNTER — Other Ambulatory Visit: Payer: Self-pay | Admitting: *Deleted

## 2019-02-23 MED ORDER — ASPIRIN EC 81 MG PO TBEC: 81.0000 mg | DELAYED_RELEASE_TABLET | Freq: Every day | ORAL | 3 refills | Status: DC

## 2019-02-23 NOTE — Progress Notes (Signed)
Per dr Ellyn Hack, recommendation from coronary scoring - continue with 81 mg enteric-coated aspirin daily

## 2019-03-30 ENCOUNTER — Encounter: Payer: Self-pay | Admitting: Cardiology

## 2019-03-30 ENCOUNTER — Ambulatory Visit: Payer: Managed Care, Other (non HMO) | Admitting: Cardiology

## 2019-03-30 ENCOUNTER — Other Ambulatory Visit: Payer: Self-pay

## 2019-03-30 DIAGNOSIS — F17200 Nicotine dependence, unspecified, uncomplicated: Secondary | ICD-10-CM

## 2019-03-30 DIAGNOSIS — I2510 Atherosclerotic heart disease of native coronary artery without angina pectoris: Secondary | ICD-10-CM | POA: Diagnosis not present

## 2019-03-30 DIAGNOSIS — E785 Hyperlipidemia, unspecified: Secondary | ICD-10-CM | POA: Diagnosis not present

## 2019-03-30 NOTE — Progress Notes (Signed)
PCP: Shirline Frees, NP  Clinic Note: Chief Complaint  Patient presents with  . Follow-up    Test results.  Asymptomatic    HPI: Kurt Duncan is a 63 y.o. male (smoker) who is being seen today for the f/u after initial evaluation of Coronary Atherosclerosis on CT Scan.  He is a 50+ year smoking history of about a 1/2-1 PPD but without other significant risk factors besides being a 63 year old for CAD.  Raijon Lindfors was seen on initial consultation at the request of Shirline Frees, NP in response to coronary calcification noted on screening chest CT.  Patient himself is quite asymptomatic.  Very active despite not doing routine exercise.  No angina or dyspnea with rest or exertion. --> We chose to do baseline risk assessment by quantification of his coronary calcium use and coronary calcium score.  Depending on the results of that, would consider further testing.  Recent Hospitalizations: None  Studies Personally Reviewed - (if available, images/films reviewed: From Epic Chart or Care Everywhere)  Coronary Calcium (02/22/2019)-coronary calcium score 73 (relatively low risk).   Interval History: Carless returns today still noting that he is asymptomatic from a cardiac standpoint.  He is still smoking, but understands importance of quitting.  Just not ready to quit yet.  Still staying active without any chest pressure tightness pressure exertion.  No exertional dyspnea.  No claudication.  Cardiovascular review of symptoms: no chest pain or dyspnea on exertion negative for - edema, irregular heartbeat, orthopnea, palpitations, paroxysmal nocturnal dyspnea, rapid heart rate, shortness of breath or Syncope/near syncope, TIA/amaurosis fugax.  Review of Systems  Constitutional: Negative for malaise/fatigue.  Respiratory: Negative for cough, shortness of breath and wheezing.   Cardiovascular:       Per HPI  Gastrointestinal: Negative for constipation, heartburn and nausea.   Neurological: Negative for dizziness and headaches.   The patient does not have symptoms concerning for COVID-19 infection (fever, chills, cough, or new shortness of breath).  The patient is practicing social distancing.  I have reviewed and (if needed) personally updated the patient's problem list, medications, allergies, past medical and surgical history, social and family history.   Past Medical History:  Diagnosis Date  . Coronary artery calcification seen on CT scan 01/2019   Coronary calcium score 73  . Current smoker    One half PPD x50 years  . History of pyelonephritis   . Hyperlipidemia     Past Surgical History:  Procedure Laterality Date  . COLONOSCOPY  2012   Normal    Current Meds  Medication Sig  . atorvastatin (LIPITOR) 40 MG tablet Take 40 mg by mouth daily.    No Known Allergies  Social History   Tobacco Use  . Smoking status: Current Every Day Smoker    Packs/day: 0.50    Years: 48.00    Pack years: 24.00    Types: Cigarettes  . Smokeless tobacco: Current User  . Tobacco comment: Encouaged patient to quit. He is interested and he states that his smoking is a "pasttime"  Substance Use Topics  . Alcohol use: No    Alcohol/week: 0.0 standard drinks  . 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  Wt Readings from Last 3 Encounters:  03/30/19 128 lb (58.1 kg)  01/14/19 124 lb 6.4 oz (56.4 kg)  09/08/18 117 lb 6.4 oz (53.3 kg)    PHYSICAL EXAM BP 129/84   Pulse 69   Temp (!) 96.8 F (36 C)   Ht 5\' 4"  (1.626 m)   Wt 128 lb (58.1 kg)   SpO2 100%   BMI 21.97 kg/m  Physical Exam  Constitutional: He is oriented to person, place, and time. He appears well-developed and  well-nourished. No distress.  Healthy-appearing gentleman.  He looks younger than stated age.  HENT:  Head: Normocephalic and atraumatic.  Mouth/Throat: Oropharynx is clear and moist. No oropharyngeal exudate.  Eyes: Pupils are equal, round, and reactive to light. Conjunctivae and EOM are normal. No scleral icterus.  Neck: Normal range of motion. Neck supple. No hepatojugular reflux and no JVD present. Carotid bruit is not present.  Cardiovascular: Normal rate, regular rhythm, normal heart sounds and intact distal pulses.  No extrasystoles are present. PMI is not displaced. Exam reveals no gallop and no friction rub.  No murmur heard. Pulmonary/Chest: Effort normal and breath sounds normal. No respiratory distress. He has no wheezes. He has no rales.  Abdominal: Soft. Bowel sounds are normal. He exhibits no distension. There is no abdominal tenderness. There is no rebound.  Musculoskeletal: Normal range of motion.        General: No edema.  Neurological: He is alert and oriented to person, place, and time. No cranial nerve deficit.  Skin: Skin is warm and dry. No rash noted. No erythema.  Psychiatric: He has a normal mood and affect. His behavior is normal. Judgment and thought content normal.  Vitals reviewed.    Adult ECG Report n/a  Other studies Reviewed: Additional studies/ records that were reviewed today include:  Recent Labs: No new labs since September 08, 2018: TC 183, TG 148, HDL 49, LDL 104.  Hemoglobin 15.5.  CR 1.34.  K+ 4.3.  TSH 1.53.   ASSESSMENT / PLAN: Problem List Items Addressed This Visit    Hyperlipidemia with target low density lipoprotein (LDL) cholesterol less than 100 mg/dL (Chronic)    Target LDL should be less than 100 given his evidence of coronary calcification.  With relatively low coronary calcium score 100 is reasonable, however would always be beneficial to be less than 70. Continue current dose of statin.      Relevant Medications   atorvastatin  (LIPITOR) 40 MG tablet   Tobacco use disorder (Chronic)    Smoking cessation instruction/counseling given:  counseled patient on the dangers of tobacco use, advised patient to stop smoking, and reviewed strategies to maximize success   He does not seem to be all that interested in smoking cessation at this point.  Continue to discuss.      Coronary artery calcification seen on computed tomography (Chronic)    Relatively low coronary calcium score of 73.  In the absence of any symptoms.  Would not necessarily do any further testing at this point.  I think we will see him back in about a year at which time we can consider checking a GXT for screening.  Would recommend continued management of his lipids with atorvastatin.   --Target LDL should be less than 100 if not closer to 70.  ..  Also reinforced importance of smoking cessation.  Would probably not benefit from aspirin.      Relevant Medications   atorvastatin (  LIPITOR) 40 MG tablet       COVID-19 Education: The signs and symptoms of COVID-19 were discussed with the patient and how to seek care for testing (follow up with PCP or arrange E-visit).   The importance of social distancing was discussed today.  I spent a total of 15 minutes with the patient and chart review. >  50% of the time was spent in direct patient consultation.   Current medicines are reviewed at length with the patient today.  (+/- concerns) none The following changes have been made:  None  Patient Instructions  Medication Instructions:  Stop aspirin If you need a refill on your cardiac medications before your next appointment, please call your pharmacy.   Lab work: Not needed .  Testing/Procedures:  Not needed   Follow-Up: At [facility], you and your health needs are our priority.  As part of our continuing mission to provide you with exceptional heart care, we have created designated Provider Care Teams.  These Care Teams include your primary  Cardiologist (physician) and Advanced Practice Providers (APPs -  Physician Assistants and Nurse Practitioners) who all work together to provide you with the care you need, when you need it. . You will need a follow up appointment in  12  Months- Sept 2021.  Please call our office 2 months in advance to schedule this appointment.  You may see Bryan Lemmaavid Bari Leib, MD or one of the following Advanced Practice Providers on your designated Care Team:   . Theodore DemarkRhonda Barrett, PA-C . Joni ReiningKathryn Lawrence, DNP, ANP  Any Other Special Instructions Will Be Listed Below (If Applicable).    No orders of the defined types were placed in this encounter.     Bryan Lemmaavid Atia Haupt, M.D., M.S. Interventional Cardiologist   Pager # [phone] Phone # [phone] [address] [place], [place] [address]   Thank you for choosing Heartcare at [facility]!!

## 2019-03-30 NOTE — Patient Instructions (Addendum)
Medication Instructions:  Stop aspirin If you need a refill on your cardiac medications before your next appointment, please call your pharmacy.   Lab work: Not needed .  Testing/Procedures:  Not needed   Follow-Up: At [facility], you and your health needs are our priority.  As part of our continuing mission to provide you with exceptional heart care, we have created designated Provider Care Teams.  These Care Teams include your primary Cardiologist (physician) and Advanced Practice Providers (APPs -  Physician Assistants and Nurse Practitioners) who all work together to provide you with the care you need, when you need it. . You will need a follow up appointment in  12  Months- Sept 2021.  Please call our office 2 months in advance to schedule this appointment.  You may see Glenetta Hew, MD or one of the following Advanced Practice Providers on your designated Care Team:   . Rosaria Ferries, PA-C . Jory Sims, DNP, ANP  Any Other Special Instructions Will Be Listed Below (If Applicable).

## 2019-04-01 ENCOUNTER — Encounter: Payer: Self-pay | Admitting: Cardiology

## 2019-04-01 NOTE — Assessment & Plan Note (Signed)
Target LDL should be less than 100 given his evidence of coronary calcification.  With relatively low coronary calcium score 100 is reasonable, however would always be beneficial to be less than 70. Continue current dose of statin.

## 2019-04-01 NOTE — Assessment & Plan Note (Signed)
Smoking cessation instruction/counseling given:  counseled patient on the dangers of tobacco use, advised patient to stop smoking, and reviewed strategies to maximize success   He does not seem to be all that interested in smoking cessation at this point.  Continue to discuss.

## 2019-04-01 NOTE — Assessment & Plan Note (Signed)
Relatively low coronary calcium score of 73.  In the absence of any symptoms.  Would not necessarily do any further testing at this point.  I think we will see him back in about a year at which time we can consider checking a GXT for screening.  Would recommend continued management of his lipids with atorvastatin.   --Target LDL should be less than 100 if not closer to 70.  ..  Also reinforced importance of smoking cessation.  Would probably not benefit from aspirin.

## 2019-07-07 ENCOUNTER — Encounter: Payer: Self-pay | Admitting: Cardiology

## 2019-07-07 ENCOUNTER — Other Ambulatory Visit: Payer: Self-pay

## 2019-07-07 ENCOUNTER — Ambulatory Visit: Payer: Managed Care, Other (non HMO) | Admitting: Cardiology

## 2019-07-07 VITALS — BP 151/93 | HR 75 | Temp 97.9°F | Ht 64.0 in | Wt 126.0 lb

## 2019-07-07 DIAGNOSIS — I2510 Atherosclerotic heart disease of native coronary artery without angina pectoris: Secondary | ICD-10-CM | POA: Diagnosis not present

## 2019-07-07 DIAGNOSIS — I10 Essential (primary) hypertension: Secondary | ICD-10-CM

## 2019-07-07 DIAGNOSIS — E785 Hyperlipidemia, unspecified: Secondary | ICD-10-CM

## 2019-07-07 DIAGNOSIS — Z0000 Encounter for general adult medical examination without abnormal findings: Secondary | ICD-10-CM | POA: Diagnosis not present

## 2019-07-07 DIAGNOSIS — F17200 Nicotine dependence, unspecified, uncomplicated: Secondary | ICD-10-CM

## 2019-07-07 MED ORDER — ASPIRIN EC 325 MG PO TBEC: DELAYED_RELEASE_TABLET | ORAL | 0 refills | Status: AC

## 2019-07-07 NOTE — Progress Notes (Signed)
Primary Care Provider: Shirline Frees, NP Cardiologist: Bryan Lemma, MD Electrophysiologist:   Clinic Note: Chief Complaint  Patient presents with  . Follow-up    No symptoms    HPI:    Kurt Duncan is a 64 y.o. male who is being seen today for the evaluation of f/u evaluation for Coronary Artery Calcification (Score 73) at the request of Shirline Frees, NP.  Kurt Duncan was last seen in Sept 2020 for follow-up evaluation of coronary artery calcium with the results showing coronary calcium score of 73.  Plan was for annual follow-up, but he now presents as 1 to 2 months instead of 79-month follow-up.  Recent Hospitalizations: None  Reviewed  CV studies:    The following studies were reviewed today: (if available, images/films reviewed: From Epic Chart or Care Everywhere) . None:   Interval History:   Kurt Duncan returns here today again doing very well with no cardiac symptoms to speak of.  He still has not fully quit smoking, but is working on trying to.  He otherwise is as active as he can be.  Does not really do routine exercise however as we talked about the importance of that.  He is not having any angina or heart failure symptoms as noted below.  CV Review of Symptoms (Summary): no chest pain or dyspnea on exertion negative for - edema, irregular heartbeat, orthopnea, palpitations, paroxysmal nocturnal dyspnea, rapid heart rate, shortness of breath or Syncope/near syncope, TIA/chondrosis fugax, claudication.  The patient DOES NOT have symptoms concerning for COVID-19 infection (fever, chills, cough, or new shortness of breath).  The patient is practicing social distancing. ++ Masking.  He is careful when he goes out occasionally for groceries/shopping.   REVIEWED OF SYSTEMS   A system limited ROS was performed. Review of Systems  Constitutional: Negative for malaise/fatigue and weight loss.  HENT: Negative for nosebleeds.   Respiratory: Negative  for cough, shortness of breath and wheezing.   Gastrointestinal: Negative for blood in stool and melena.  Genitourinary: Negative for hematuria.   I have reviewed and (if needed) personally updated the patient's problem list, medications, allergies, past medical and surgical history, social and family history.   PAST MEDICAL HISTORY   Past Medical History:  Diagnosis Date  . Coronary artery calcification seen on CT scan 01/2019   Coronary calcium score 73  . Current smoker    One half PPD x50 years  . History of pyelonephritis   . Hyperlipidemia      PAST SURGICAL HISTORY   Past Surgical History:  Procedure Laterality Date  . COLONOSCOPY  2012   Normal     MEDICATIONS/ALLERGIES   Current Meds  Medication Sig  . atorvastatin (LIPITOR) 40 MG tablet Take 40 mg by mouth daily.    No Known Allergies   SOCIAL HISTORY/FAMILY HISTORY   Social History   Tobacco Use  . Smoking status: Current Every Day Smoker    Packs/day: 0.50    Years: 48.00    Pack years: 24.00    Types: Cigarettes  . Smokeless tobacco: Current User  . Tobacco comment: Encouaged patient to quit. He is interested and he states that his smoking is a "pasttime"  Substance Use Topics  . Alcohol use: No    Alcohol/week: 0.0 standard drinks  . 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   OBJCTIVE -PE, EKG, labs   Wt Readings from Last 3 Encounters:  07/07/19 126 lb (57.2 kg)  03/30/19 128 lb (58.1 kg)  01/14/19 124 lb 6.4 oz (56.4 kg)    Physical Exam: BP (!) 151/93   Pulse 75   Temp 97.9 F (36.6 C)   Ht 5\' 4"  (1.626 m)   Wt 126 lb (57.2 kg)   SpO2 100%   BMI 21.63 kg/m  Physical Exam  Constitutional:  He is oriented to person, place, and time. He appears well-developed and well-nourished. No distress.  Healthy-appearing.  Well-groomed  HENT:  Head: Normocephalic and atraumatic.  Neck: No hepatojugular reflux and no JVD present. Carotid bruit is not present.  Cardiovascular: Normal rate, regular rhythm, normal heart sounds and intact distal pulses.  Occasional extrasystoles are present. PMI is not displaced. Exam reveals no gallop.  No murmur heard. Pulmonary/Chest: Effort normal and breath sounds normal. No respiratory distress. He has no wheezes. He has no rales.  Musculoskeletal:        General: No edema. Normal range of motion.     Cervical back: Normal range of motion and neck supple.  Neurological: He is oriented to person, place, and time.  Psychiatric: He has a normal mood and affect. His behavior is normal. Judgment and thought content normal.  Vitals reviewed.    Adult ECG Report N/a  Recent Labs: No recent labs Lab Results  Component Value Date   CHOL 183 09/08/2018   HDL 49.10 09/08/2018   LDLCALC 104 (H) 09/08/2018   LDLDIRECT 151.3 08/02/2013   TRIG 148.0 09/08/2018   CHOLHDL 4 09/08/2018   Lab Results  Component Value Date   CREATININE 1.47 10/14/2018   BUN 19 10/14/2018   NA 141 10/14/2018   K 4.3 10/14/2018   CL 104 10/14/2018   CO2 29 10/14/2018    ASSESSMENT/PLAN    Problem List Items Addressed This Visit    Hyperlipidemia with target low density lipoprotein (LDL) cholesterol less than 100 mg/dL (Chronic)    Most recent lipids showed there is LDL was just about 100.  He is on atorvastatin 40 mg daily.  Should be due to have labs checked soon by PCP.      Relevant Medications   aspirin EC 325 MG tablet   Tobacco use disorder (Chronic)    Working on quitting, but has not gotten there yet      Coronary artery calcification seen on computed tomography - Primary (Chronic)    Relatively low coronary calcium score.  With his wrist factors of  hyperlipidemia and now high blood pressures today along with smoking, I think is probably not unreasonable for him to be on aspirin.  He prefers not taking every day medicine so we compromised for him to take a half a tablet of the full dose aspirin 3 days a week indicated below this is about the same was taking an 81 mg tablet every day.      Relevant Medications   aspirin EC 325 MG tablet   Preventative health care   High blood pressure determined by examination    Blood pressure is high today, but that is relatively new for him.  I think we need to just monitor his pressures to ensure that as a risk  factor it is adequately treated.  He is due to see his PCP soon in March, if pressures are still elevated at that time, would probably want to consider treatment and would probably opt for either diuretic or ARB.          COVID-19 Education: The signs and symptoms of COVID-19 were discussed with the patient and how to seek care for testing (follow up with PCP or arrange E-visit).   The importance of social distancing was discussed today.  I spent a total of 15 minutes with the patient and chart review. >  50% of the time was spent in direct patient consultation.  Additional time spent with chart review (studies, outside notes, etc): 5 Total Time:   Current medicines are reviewed at length with the patient today.  (+/- concerns) n/a he had questions about whether or not to start aspirin.   Patient Instructions / Medication Changes & Studies & Tests Ordered   Patient Instructions  Medication Instructions:   Start taking 1/2 tablet of 325 mg enteric coated Aspirin every other day .  *If you need a refill on your cardiac medications before your next appointment, please call your pharmacy*  Lab Work: Not needed  Testing/Procedures:  not needed  Follow-Up: At [facility], you and your health needs are our priority.  As part of our continuing mission to provide you with  exceptional heart care, we have created designated Provider Care Teams.  These Care Teams include your primary Cardiologist (physician) and Advanced Practice Providers (APPs -  Physician Assistants and Nurse Practitioners) who all work together to provide you with the care you need, when you need it.  Your next appointment:   12 month(s)  The format for your next appointment:   In Person  Provider:   Bryan Lemma, MD  Other Instructions    Studies Ordered:   No orders of the defined types were placed in this encounter.    Bryan Lemma, M.D., M.S. Interventional Cardiologist   Pager # [phone] Phone # [phone] [address] [place], [place] [address]   Thank you for choosing Heartcare at [facility]!!

## 2019-07-07 NOTE — Patient Instructions (Signed)
Medication Instructions:   Start taking 1/2 tablet of 325 mg enteric coated Aspirin every other day .  *If you need a refill on your cardiac medications before your next appointment, please call your pharmacy*  Lab Work: Not needed  Testing/Procedures:  not needed  Follow-Up: At [facility], you and your health needs are our priority.  As part of our continuing mission to provide you with exceptional heart care, we have created designated Provider Care Teams.  These Care Teams include your primary Cardiologist (physician) and Advanced Practice Providers (APPs -  Physician Assistants and Nurse Practitioners) who all work together to provide you with the care you need, when you need it.  Your next appointment:   12 month(s)  The format for your next appointment:   In Person  Provider:   Bryan Lemma, MD  Other Instructions

## 2019-07-08 ENCOUNTER — Encounter: Payer: Self-pay | Admitting: Cardiology

## 2019-07-08 DIAGNOSIS — I10 Essential (primary) hypertension: Secondary | ICD-10-CM | POA: Insufficient documentation

## 2019-07-08 NOTE — Assessment & Plan Note (Signed)
Blood pressure is high today, but that is relatively new for him.  I think we need to just monitor his pressures to ensure that as a risk factor it is adequately treated.  He is due to see his PCP soon in March, if pressures are still elevated at that time, would probably want to consider treatment and would probably opt for either diuretic or ARB.

## 2019-07-08 NOTE — Assessment & Plan Note (Signed)
Most recent lipids showed there is LDL was just about 100.  He is on atorvastatin 40 mg daily.  Should be due to have labs checked soon by PCP.

## 2019-07-08 NOTE — Assessment & Plan Note (Signed)
Working on quitting, but has not gotten there yet

## 2019-07-08 NOTE — Assessment & Plan Note (Signed)
Relatively low coronary calcium score.  With his wrist factors of hyperlipidemia and now high blood pressures today along with smoking, I think is probably not unreasonable for him to be on aspirin.  He prefers not taking every day medicine so we compromised for him to take a half a tablet of the full dose aspirin 3 days a week indicated below this is about the same was taking an 81 mg tablet every day.

## 2019-08-30 ENCOUNTER — Other Ambulatory Visit: Payer: Self-pay | Admitting: Adult Health

## 2019-08-30 DIAGNOSIS — I2510 Atherosclerotic heart disease of native coronary artery without angina pectoris: Secondary | ICD-10-CM

## 2019-08-31 NOTE — Telephone Encounter (Signed)
Sent to the pharmacy by e-scribe.  Pt has upcoming cpx. 

## 2019-09-08 ENCOUNTER — Other Ambulatory Visit: Payer: Self-pay

## 2019-09-09 ENCOUNTER — Ambulatory Visit (INDEPENDENT_AMBULATORY_CARE_PROVIDER_SITE_OTHER): Payer: Managed Care, Other (non HMO) | Admitting: Adult Health

## 2019-09-09 ENCOUNTER — Encounter: Payer: Self-pay | Admitting: Adult Health

## 2019-09-09 VITALS — BP 130/82 | Temp 97.2°F | Ht 65.0 in | Wt 129.0 lb

## 2019-09-09 DIAGNOSIS — I2510 Atherosclerotic heart disease of native coronary artery without angina pectoris: Secondary | ICD-10-CM

## 2019-09-09 DIAGNOSIS — Z125 Encounter for screening for malignant neoplasm of prostate: Secondary | ICD-10-CM

## 2019-09-09 DIAGNOSIS — F17200 Nicotine dependence, unspecified, uncomplicated: Secondary | ICD-10-CM

## 2019-09-09 DIAGNOSIS — E785 Hyperlipidemia, unspecified: Secondary | ICD-10-CM | POA: Diagnosis not present

## 2019-09-09 DIAGNOSIS — R030 Elevated blood-pressure reading, without diagnosis of hypertension: Secondary | ICD-10-CM | POA: Diagnosis not present

## 2019-09-09 DIAGNOSIS — Z0000 Encounter for general adult medical examination without abnormal findings: Secondary | ICD-10-CM | POA: Diagnosis not present

## 2019-09-09 DIAGNOSIS — N1832 Chronic kidney disease, stage 3b: Secondary | ICD-10-CM

## 2019-09-09 LAB — LIPID PANEL
Cholesterol: 139 mg/dL (ref 0–200)
HDL: 48.4 mg/dL (ref >39.00)
LDL Cholesterol: 67 mg/dL (ref 0–99)
NonHDL: 90.23
Total CHOL/HDL Ratio: 3
Triglycerides: 117 mg/dL (ref 0.0–149.0)
VLDL: 23.4 mg/dL (ref 0.0–40.0)

## 2019-09-09 LAB — COMPREHENSIVE METABOLIC PANEL
ALT: 33 U/L (ref 0–53)
AST: 24 U/L (ref 0–37)
Albumin: 4.4 g/dL (ref 3.5–5.2)
Alkaline Phosphatase: 86 U/L (ref 39–117)
BUN: 23 mg/dL (ref 6–23)
CO2: 30 mEq/L (ref 19–32)
Calcium: 9.5 mg/dL (ref 8.4–10.5)
Chloride: 102 mEq/L (ref 96–112)
Creatinine, Ser: 1.5 mg/dL (ref 0.40–1.50)
GFR: 47.35 mL/min — ABNORMAL LOW (ref >60.00)
Glucose, Bld: 80 mg/dL (ref 70–99)
Potassium: 4.2 mEq/L (ref 3.5–5.1)
Sodium: 138 mEq/L (ref 135–145)
Total Bilirubin: 0.9 mg/dL (ref 0.2–1.2)
Total Protein: 7.2 g/dL (ref 6.0–8.3)

## 2019-09-09 LAB — CBC WITH DIFFERENTIAL/PLATELET
Basophils Absolute: 0 10*3/uL (ref 0.0–0.1)
Basophils Relative: 0.5 % (ref 0.0–3.0)
Eosinophils Absolute: 0.5 10*3/uL (ref 0.0–0.7)
Eosinophils Relative: 6 % — ABNORMAL HIGH (ref 0.0–5.0)
HCT: 47.2 % (ref 39.0–52.0)
Hemoglobin: 15.9 g/dL (ref 13.0–17.0)
Lymphocytes Relative: 33 % (ref 12.0–46.0)
Lymphs Abs: 2.7 10*3/uL (ref 0.7–4.0)
MCHC: 33.8 g/dL (ref 30.0–36.0)
MCV: 91.6 fl (ref 78.0–100.0)
Monocytes Absolute: 0.8 10*3/uL (ref 0.1–1.0)
Monocytes Relative: 9.6 % (ref 3.0–12.0)
Neutro Abs: 4.1 10*3/uL (ref 1.4–7.7)
Neutrophils Relative %: 50.9 % (ref 43.0–77.0)
Platelets: 209 10*3/uL (ref 150.0–400.0)
RBC: 5.15 Mil/uL (ref 4.22–5.81)
RDW: 13.3 % (ref 11.5–15.5)
WBC: 8.1 10*3/uL (ref 4.0–10.5)

## 2019-09-09 LAB — PSA: PSA: 0.65 ng/mL (ref 0.10–4.00)

## 2019-09-09 LAB — TSH: TSH: 1.36 u[IU]/mL (ref 0.35–4.50)

## 2019-09-09 MED ORDER — ATORVASTATIN CALCIUM 40 MG PO TABS: 40.0000 mg | ORAL_TABLET | Freq: Every day | ORAL | 3 refills | Status: DC

## 2019-09-09 NOTE — Progress Notes (Signed)
Subjective:    Patient ID: Kurt Duncan, male    DOB: 1955-12-23, 64 y.o.   MRN: [id number]  HPI Patient presents for yearly preventative medicine examination. He is a pleasant 64 year old who  has a past medical history of Coronary artery calcification seen on CT scan (01/2019), Current smoker, History of pyelonephritis, and Hyperlipidemia.  Hyperlipidemia/CAD -currently prescribed Lipitor 40 mg and aspirin daily.  He denies myalgia, fatigue, chest pain, or shortness of breath.  He is followed by cardiology on a routine basis.  Lab Results  Component Value Date   CHOL 183 09/08/2018   HDL 49.10 09/08/2018   LDLCALC 104 (H) 09/08/2018   LDLDIRECT 151.3 08/02/2013   TRIG 148.0 09/08/2018   CHOLHDL 4 09/08/2018   Elevated Blood Pressure readings -was noted at cardiology in January 2021.  He was not started on medication at this time as this was a new finding for him.  Tobacco Use -has not fully quit smoking, but is working to trying to do so.  He does not want any medications to help him quit. He is smoking less than half a pack a day.   Chronic Kidney Disease - is seen by Dr. Justin Mend at [facility]. He had a renal ultrasound done in May 2020 which was negative.   All immunizations and health maintenance protocols were reviewed with the patient and needed orders were placed. He is up to date on routine vaccinations   Appropriate screening laboratory values were ordered for the patient including screening of hyperlipidemia, renal function and hepatic function. If indicated by BPH, a PSA was ordered.  Medication reconciliation,  past medical history, social history, problem list and allergies were reviewed in detail with the patient  Goals were established with regard to weight loss, exercise, and  diet in compliance with medications BP Readings from Last 3 Encounters:  07/07/19 (!) 151/93  03/30/19 129/84  01/14/19 126/77   He is due for his next colonoscopy in  three months  He has no acute complaints.   Review of Systems  Constitutional: Negative.   HENT: Negative.   Eyes: Negative.   Respiratory: Negative.   Cardiovascular: Negative.   Gastrointestinal: Negative.   Endocrine: Negative.   Genitourinary: Negative.   Musculoskeletal: Negative.   Skin: Negative.   Allergic/Immunologic: Negative.   Neurological: Negative.   Hematological: Negative.   Psychiatric/Behavioral: Negative.   All other systems reviewed and are negative.  Past Medical History:  Diagnosis Date  . Coronary artery calcification seen on CT scan 01/2019   Coronary calcium score 73  . Current smoker    One half PPD x50 years  . History of pyelonephritis   . Hyperlipidemia     Social History   Socioeconomic History  . Marital status: Married    Spouse name: Not on file  . Number of children: 3  . Years of education: Not on file  . Highest education level: Not on file  Occupational History  . Occupation: Therapist, sports: Teaching laboratory technician  Tobacco Use  . Smoking status: Current Every Day Smoker    Packs/day: 0.50    Years: 48.00    Pack years: 24.00    Types: Cigarettes  . Smokeless tobacco: Current User  . Tobacco comment: Encouaged patient to quit. He is interested and he states that his smoking is a "pasttime"  Substance and Sexual Activity  . Alcohol use: No    Alcohol/week: 0.0 standard drinks  .  Drug use: No  . Sexual activity: Yes  Other Topics Concern  . 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 Difficulty of Paying Living Expenses:   Food Insecurity:   . Worried About Programme researcher, broadcasting/film/video in the Last Year:   . Barista in the Last Year:   Transportation Needs:   . Freight forwarder (Medical):    Marland Kitchen Lack of Transportation (Non-Medical):   Physical Activity:   . Days of Exercise per Week:   . Minutes of Exercise per Session:   Stress:   . Feeling of Stress :   Social Connections:   . Frequency of Communication with Friends and Family:   . Frequency of Social Gatherings with Friends and Family:   . Attends Religious Services:   . Active Member of Clubs or Organizations:   . Attends Banker Meetings:   Marland Kitchen Marital Status:   Intimate Partner Violence:   . Fear of Current or Ex-Partner:   . Emotionally Abused:   Marland Kitchen Physically Abused:   . Sexually Abused:     Past Surgical History:  Procedure Laterality Date  . COLONOSCOPY  2012   Normal    Family History  Problem Relation Age of Onset  . Hypertension Mother   . Cancer Father 45       brain    No Known Allergies  Current Outpatient Medications on File Prior to Visit  Medication Sig Dispense Refill  . aspirin EC 325 MG tablet Take 1/2 tablet of 325 mg every other day 30 tablet 0  . atorvastatin (LIPITOR) 40 MG tablet TAKE 1 TABLET BY MOUTH EVERY DAY 30 tablet 0   No current facility-administered medications on file prior to visit.    There were no vitals taken for this visit.      Objective:   Physical Exam Vitals and nursing note reviewed.  Constitutional:      General: He is not in acute distress.    Appearance: Normal appearance. He is well-developed and normal weight.  HENT:     Head: Normocephalic and atraumatic.     Right Ear: Tympanic membrane, ear canal and external ear normal. There is no impacted cerumen.     Left Ear: Tympanic membrane, ear canal and external ear normal. There is no impacted cerumen.     Nose: Nose normal. No congestion or rhinorrhea.     Mouth/Throat:     Mouth: Mucous membranes are moist.     Pharynx: Oropharynx is clear. No oropharyngeal exudate or posterior oropharyngeal erythema.  Eyes:     General:        Right eye: No discharge.        Left eye: No  discharge.     Extraocular Movements: Extraocular movements intact.     Conjunctiva/sclera: Conjunctivae normal.     Pupils: Pupils are equal, round, and reactive to light.  Neck:     Vascular: No carotid bruit.     Trachea: No tracheal deviation.  Cardiovascular:     Rate and Rhythm: Normal rate and regular rhythm.     Pulses: Normal pulses.     Heart sounds: Normal heart sounds. No murmur. No friction  rub. No gallop.   Pulmonary:     Effort: Pulmonary effort is normal. No respiratory distress.     Breath sounds: Normal breath sounds. No stridor. No wheezing, rhonchi or rales.  Chest:     Chest wall: No tenderness.  Abdominal:     General: Bowel sounds are normal. There is no distension.     Palpations: Abdomen is soft. There is no mass.     Tenderness: There is no abdominal tenderness. There is no right CVA tenderness, left CVA tenderness, guarding or rebound.     Hernia: No hernia is present.  Musculoskeletal:        General: No swelling, tenderness, deformity or signs of injury. Normal range of motion.     Right lower leg: No edema.     Left lower leg: No edema.  Lymphadenopathy:     Cervical: No cervical adenopathy.  Skin:    General: Skin is warm and dry.     Capillary Refill: Capillary refill takes less than 2 seconds.     Coloration: Skin is not jaundiced or pale.     Findings: No bruising, erythema, lesion or rash.  Neurological:     General: No focal deficit present.     Mental Status: He is alert and oriented to person, place, and time.     Cranial Nerves: No cranial nerve deficit.     Sensory: No sensory deficit.     Motor: No weakness.     Coordination: Coordination normal.     Gait: Gait normal.     Deep Tendon Reflexes: Reflexes normal.  Psychiatric:        Mood and Affect: Mood normal.        Behavior: Behavior normal.        Thought Content: Thought content normal.        Judgment: Judgment normal.       Assessment & Plan:  1. Routine general medical  examination at a health care facility - Needs to quit smoking  - Encouraged heart healthy diet and exercise  - Follow up in one year or sooner if needed - CBC with Differential/Platelet - Comprehensive metabolic panel - Lipid panel - TSH  2. Hyperlipidemia, unspecified hyperlipidemia type - Continue with Lipitor 40 mg  - CBC with Differential/Platelet - Comprehensive metabolic panel - Lipid panel - TSH - atorvastatin (LIPITOR) 40 MG tablet; Take 1 tablet (40 mg total) by mouth daily.  Dispense: 90 tablet; Refill: 3  3. Tobacco use disorder - encouraged to quit smoking. He refused medications at this time   4. Prostate cancer screening  - PSA  5. Coronary artery disease involving native heart without angina pectoris, unspecified vessel or lesion type - Follow up with Cardiology as directed - CBC with Differential/Platelet - Comprehensive metabolic panel - Lipid panel - TSH - atorvastatin (LIPITOR) 40 MG tablet; Take 1 tablet (40 mg total) by mouth daily.  Dispense: 90 tablet; Refill: 3  6. Elevated blood pressure reading - Normotensive in the office today  - Continue to monitor  - CBC with Differential/Platelet - Comprehensive metabolic panel - Lipid panel - TSH  7. Stage 3b chronic kidney disease - Follow up with Nephrology as directed - low sodium diet - CBC with Differential/Platelet - Comprehensive metabolic panel - Lipid panel - TSH   Shirline Frees, NP

## 2020-02-07 LAB — HM COLONOSCOPY

## 2020-02-08 ENCOUNTER — Encounter: Payer: Self-pay | Admitting: Family Medicine

## 2020-02-10 ENCOUNTER — Ambulatory Visit (INDEPENDENT_AMBULATORY_CARE_PROVIDER_SITE_OTHER)
Admission: RE | Admit: 2020-02-10 | Discharge: 2020-02-10 | Disposition: A | Discharge status: Home / Self Care | Payer: Managed Care, Other (non HMO) | Source: Ambulatory Visit | Attending: Acute Care | Admitting: Acute Care

## 2020-02-10 ENCOUNTER — Other Ambulatory Visit: Payer: Self-pay

## 2020-02-10 DIAGNOSIS — Z87891 Personal history of nicotine dependence: Secondary | ICD-10-CM

## 2020-02-10 DIAGNOSIS — Z122 Encounter for screening for malignant neoplasm of respiratory organs: Secondary | ICD-10-CM

## 2020-02-10 DIAGNOSIS — F17210 Nicotine dependence, cigarettes, uncomplicated: Secondary | ICD-10-CM

## 2020-02-17 ENCOUNTER — Other Ambulatory Visit: Payer: Self-pay | Admitting: *Deleted

## 2020-02-17 DIAGNOSIS — F17210 Nicotine dependence, cigarettes, uncomplicated: Secondary | ICD-10-CM

## 2020-02-17 NOTE — Progress Notes (Signed)

## 2020-07-19 ENCOUNTER — Other Ambulatory Visit: Payer: Self-pay

## 2020-07-20 ENCOUNTER — Encounter: Payer: Self-pay | Admitting: Adult Health

## 2020-07-20 ENCOUNTER — Ambulatory Visit: Payer: Managed Care, Other (non HMO) | Admitting: Adult Health

## 2020-07-20 VITALS — BP 148/90 | Temp 97.9°F | Wt 127.0 lb

## 2020-07-20 DIAGNOSIS — K130 Diseases of lips: Secondary | ICD-10-CM

## 2020-07-20 NOTE — Progress Notes (Signed)
Subjective:    Patient ID: Kurt Duncan, male    DOB: [birth year], 64 y.o.   MRN: [id number]  HPI 65 year old male who  has a past medical history of Coronary artery calcification seen on CT scan (01/2019), Current smoker, History of pyelonephritis, and Hyperlipidemia.  He presents to the office today for an acute issue of " dry lips". He reports that his symptoms started 4 days ago. Reports chafing and splitting on his lips and the side of his mouth. He has been using chap stick, neosporin, and hydrogen peroxide.   Is not staying hydrated and continues to smoke.   Denies dry mouth or dry eye symptoms    Review of Systems See HPI   Past Medical History:  Diagnosis Date  . Coronary artery calcification seen on CT scan 01/2019   Coronary calcium score 73  . Current smoker    One half PPD x50 years  . History of pyelonephritis   . Hyperlipidemia     Social History   Socioeconomic History  . Marital status: Married    Spouse name: Not on file  . Number of children: 3  . Years of education: Not on file  . Highest education level: Not on file  Occupational History  . Occupation: Oceanographer: Hospital doctor  Tobacco Use  . Smoking status: Current Every Day Smoker    Packs/day: 0.50    Years: 48.00    Pack years: 24.00    Types: Cigarettes  . Smokeless tobacco: Current User  . Tobacco comment: Encouaged patient to quit. He is interested and he states that his smoking is a "pasttime"  Substance and Sexual Activity  . Alcohol use: No    Alcohol/week: 0.0 standard drinks  . Drug use: No  . Sexual activity: Yes  Other Topics Concern  . 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 COLONOSCOPY  2012   Normal    Family History  Problem Relation Age of Onset  . Hypertension Mother   . Cancer Father 45       brain    No Known Allergies  Current Outpatient Medications on File Prior to Visit  Medication Sig Dispense Refill  . aspirin EC 325 MG tablet Take 1/2 tablet of 325 mg every other day 30 tablet 0  . atorvastatin (LIPITOR) 40 MG tablet Take 1 tablet (40 mg total) by mouth daily. 90 tablet 3   No current facility-administered medications on file prior to visit.    BP (!) 148/90   Temp 97.9 F (36.6 C)   Wt 127 lb (57.6 kg)   BMI 21.13 kg/m       Objective:   Physical Exam Vitals and nursing note reviewed.  Constitutional:      Appearance: Normal appearance.  HENT:     Head: Normocephalic and atraumatic.     Mouth/Throat:  Mouth: Mucous membranes are moist.     Dentition: Abnormal dentition.     Comments: Severely dry lips with angular cheilitis.  Neurological:     Mental Status: He is alert.       Assessment & Plan:  1. Dry lips - No other signs of SICCA  - Advised against using hydrogen peroxide.  - Quit smoking and drink more water  - Will have him apply a thin layer of OTC hydrocortisone cream to area BID x 3 days. Use petroleum jelly products on lips to help keep moisture in  - Follow up if not resolving in the next week    Shirline Frees, NP

## 2020-08-09 NOTE — Progress Notes (Unsigned)
Virtual Visit via Video Note   This visit type was conducted due to national recommendations for restrictions regarding the COVID-19 Pandemic (e.g. social distancing) in an effort to limit this patient's exposure and mitigate transmission in our community.  Due to his co-morbid illnesses, this patient is at least at moderate risk for complications without adequate follow up.  This format is felt to be most appropriate for this patient at this time.  All issues noted in this document were discussed and addressed.  A limited physical exam was performed with this format.  Please refer to the patient's chart for his consent to telehealth for [facility].      Patient has given verbal permission to conduct this visit via virtual appointment and to bill insurance 08/10/2020 11:01 AM     Evaluation Performed:  Follow-up visit  Date:  08/10/2020   ID:  Kurt Duncan, DOB Dec 16, 1955, MRN [id number]  Patient Location: Home Provider Location: Other:  Hospital Office  PCP:  Shirline Frees, NP  Cardiologist:  Bryan Lemma, MD  Electrophysiologist:  None   Chief Complaint:  No chief complaint on file.   ASSESSMENT & PLAN:    Problem List Items Addressed This Visit    Hyperlipidemia with target low density lipoprotein (LDL) cholesterol less than 100 mg/dL (Chronic)    Most recent lipids were from March 2021.  LDL was down to 67 on current dose of atorvastatin.  Due to be followed up again in the next month or 2 when he sees his PCP.  Excellent control.  We talked with dietary modifications. No change      Tobacco use disorder (Chronic)    He is working on quitting, but has a hard time because this is the one vice that he is continued since childhood.  I explained to him the major risk factor for his CAD progression would be smoking cessation.  He knows that this the case, we talked about ways of quitting.  We talked about patches, but perhaps with his oral fixation, the controlled would  be the best option.  We talked her about 4-80minutes about the importance of smoking cessation and different options.      Coronary artery calcification seen on computed tomography - Primary (Chronic)    Given risk factors of hypertension hyper lipidemia and smoking, relatively low coronary calcium score.  We talked about his most concerning risk factor being smoking.  Smoking cessation counseling given.  Continue equivalent of 81 mg daily aspirin he is taking 162 mg twice daily. Continue statin. Monitor blood pressure.  He will get a blood pressure cuff in order to follow his pressures.  (Target systolic pressures between 110-135 mmHg, diastolic less than 85 mmHg)       High blood pressure determined by examination (Chronic)    He has had intermittent episodes of high blood pressure recorded, but does not truly have hypertension as a diagnosis.  I have asked that he monitor his pressures at home, keep a log before going to his PCP and also coming to me.  That way we can determine what his pressures truly are.  For now hold off on treatment.      Preventative health care      History of Present Illness:    Kurt Duncan is a 65 y.o. male former smoker (25-pack-year) with PMH notable for hyperlipidemia and mild Coronary Calcium Score 73 who presents via audio/video conferencing for a telehealth visit today as his annual follow-up.  Kurt Mound  Duncan was last seen 07/07/2019 -> no cardiac symptoms. Was trying to fully quit smoking. Not quite there. Not doing routine exercise, but trying to do some. No angina or heart failure symptoms. No irregular heartbeats. -> Recommended that he take one half of his 325 mg enteric aspirin every other day  Hospitalizations:  . None  Recent - Interim CV studies:    The following studies were reviewed today: . None: . CT Chest-Lung Cancer Screening 02/10/2020: Lung RADS 2. Mild centrilobular emphysema and aortic atherosclerosis. Coronary artery  calcification.  Inerval History   Kurt Duncan is doing fairly well.  No major issues.  Staying active, albeit not doing routine exercise.  Still working from home.  He is still trying to cut down his cigarettes, but is still smoking maybe half a pack a day.  Indicates that this is his only last values that he had from childhood. He is relatively asymptomatic overall from a cardiac standpoint. Cardiovascular ROS: no chest pain or dyspnea on exertion negative for - edema, irregular heartbeat, orthopnea, palpitations, paroxysmal nocturnal dyspnea, rapid heart rate, shortness of breath or Syncope or near syncope, TIA/amaurosis fugax or claudication.   ROS:  Please see the history of present illness.    The patient does not have symptoms concerning for COVID-19 infection (fever, chills, cough, or new shortness of breath).  Review of Systems  Constitutional: Negative for malaise/fatigue.  HENT: Negative for congestion and sinus pain.   Respiratory: Positive for cough (Morning cough, nonproductive). Negative for shortness of breath and wheezing.   Cardiovascular: Negative for leg swelling.  Gastrointestinal: Negative for blood in stool and melena.  Genitourinary: Negative for hematuria.  Musculoskeletal: Negative for falls.  Skin:       Was having issues with dry scaly skin  Neurological: Negative for dizziness and weakness.  Psychiatric/Behavioral: Negative for memory loss. The patient is not nervous/anxious and does not have insomnia.     The patient is practicing social distancing.  Past Medical History:  Diagnosis Date  . Coronary artery calcification seen on CT scan 01/2019   Coronary calcium score 73  . Current smoker    One half PPD x50 years  . History of pyelonephritis   . Hyperlipidemia    Past Surgical History:  Procedure Laterality Date  . COLONOSCOPY  2012   Normal     Current Meds  Medication Sig  . aspirin EC 325 MG tablet Take 1/2 tablet of 325 mg every  other day  . atorvastatin (LIPITOR) 40 MG tablet Take 1 tablet (40 mg total) by mouth daily.     Allergies:   Patient has no known allergies.   Social History   Tobacco Use  . Smoking status: Current Every Day Smoker    Packs/day: 0.50    Years: 48.00    Pack years: 24.00    Types: Cigarettes  . Smokeless tobacco: Current User  . Tobacco comment: Encouaged patient to quit. He is interested and he states that his smoking is a "pasttime"  Substance Use Topics  . Alcohol use: No    Alcohol/week: 0.0 standard drinks  . Drug use: No     Family Hx: The patient's family history includes Cancer (age of onset: 71) in his father; Hypertension in his mother.   Labs/Other Tests and Data Reviewed:    EKG:  No ECG reviewed.  Recent Labs: 09/09/2019: ALT 33; BUN 23; Creatinine, Ser 1.50; Hemoglobin 15.9; Platelets 209.0; Potassium 4.2; Sodium 138; TSH 1.36   Recent  Lipid Panel Lab Results  Component Value Date/Time   CHOL 139 09/09/2019 07:24 AM   TRIG 117.0 09/09/2019 07:24 AM   HDL 48.40 09/09/2019 07:24 AM   CHOLHDL 3 09/09/2019 07:24 AM   LDLCALC 67 09/09/2019 07:24 AM   LDLDIRECT 151.3 08/02/2013 02:11 PM    Wt Readings from Last 3 Encounters:  08/10/20 125 lb (56.7 kg)  07/20/20 127 lb (57.6 kg)  09/09/19 129 lb (58.5 kg)     Objective:    Vital Signs:  Ht 5\' 5"  (1.651 m)   Wt 125 lb (56.7 kg)   BMI 20.80 kg/m   VITAL SIGNS:  reviewed Well nourished, well developed male in no acute distress. A&O x 3.  Pleasant Mood & Affect Non-labored respirations ======================================   COVID-19 Education: The signs and symptoms of COVID-19 were discussed with the patient and how to seek care for testing (follow up with PCP or arrange E-visit).   The importance of social distancing was discussed today.  Time:   Today, I have spent 12 minutes with the patient with telehealth technology discussing the above problems.    An additional 9 minutes spent charting  (reviewing clinic notes, records, studies, labs etc. as well as writing note). 21 minutes total   Medication Adjustments/Labs and Tests Ordered: Current medicines are reviewed at length with the patient today.  Concerns regarding medicines are outlined above.   Patient Instructions  Medication Instructions:   No change current medicines  *If you need a refill on your cardiac medications before your next appointment, please call your pharmacy*   Lab Work:  Continue to follow-up labs with your primary provider-should be due in March  If you have labs (blood work) drawn today and your tests are completely normal, you will receive your results only by: April MyChart Message (if you have MyChart) OR . A paper copy in the mail If you have any lab test that is abnormal or we need to change your treatment, we will call you to review the results.   Testing/Procedures:  None for now   Follow-Up: At [facility], you and your health needs are our priority.  As part of our continuing mission to provide you with exceptional heart care, we have created designated Provider Care Teams.  These Care Teams include your primary Cardiologist (physician) and Advanced Practice Providers (APPs -  Physician Assistants and Nurse Practitioners) who all work together to provide you with the care you need, when you need it.  We recommend signing up for the patient portal called "MyChart".  Sign up information is provided on this After Visit Summary.  MyChart is used to connect with patients for Virtual Visits (Telemedicine).  Patients are able to view lab/test results, encounter notes, upcoming appointments, etc.  Non-urgent messages can be sent to your provider as well.   To learn more about what you can do with MyChart, go to [facility].    Your next appointment:   1 year(s)  The format for your next appointment:   In Person  Provider:   [email or web address], MD   Other Instructions Keep  working on cutting down the smoking -> consider using Nicorette gum to help with the oral fixation  Keep exercising  Look into getting a blood pressure cuff that you can keep track of your blood pressure (Omron is a good brand and an ARM cuff is preferred over a wrist cuff).  Maybe check it every other day at different times the day and keep  track of the log to pick to your primary provider.  Also have that with you for the month or 2 leading up to your next visit with me.     Signed, Bryan Lemma, MD  08/10/2020 11:01 AM    [facility]

## 2020-08-10 ENCOUNTER — Encounter: Payer: Self-pay | Admitting: Cardiology

## 2020-08-10 ENCOUNTER — Telehealth (INDEPENDENT_AMBULATORY_CARE_PROVIDER_SITE_OTHER): Payer: Managed Care, Other (non HMO) | Admitting: Cardiology

## 2020-08-10 VITALS — Ht 65.0 in | Wt 125.0 lb

## 2020-08-10 DIAGNOSIS — I10 Essential (primary) hypertension: Secondary | ICD-10-CM | POA: Diagnosis not present

## 2020-08-10 DIAGNOSIS — E785 Hyperlipidemia, unspecified: Secondary | ICD-10-CM

## 2020-08-10 DIAGNOSIS — F17200 Nicotine dependence, unspecified, uncomplicated: Secondary | ICD-10-CM | POA: Diagnosis not present

## 2020-08-10 DIAGNOSIS — I2510 Atherosclerotic heart disease of native coronary artery without angina pectoris: Secondary | ICD-10-CM

## 2020-08-10 DIAGNOSIS — Z0000 Encounter for general adult medical examination without abnormal findings: Secondary | ICD-10-CM

## 2020-08-10 NOTE — Assessment & Plan Note (Signed)
He is working on quitting, but has a hard time because this is the one vice that he is continued since childhood.  I explained to him the major risk factor for his CAD progression would be smoking cessation.  He knows that this the case, we talked about ways of quitting.  We talked about patches, but perhaps with his oral fixation, the controlled would be the best option.  We talked her about 4-23minutes about the importance of smoking cessation and different options.

## 2020-08-10 NOTE — Assessment & Plan Note (Signed)
He has had intermittent episodes of high blood pressure recorded, but does not truly have hypertension as a diagnosis.  I have asked that he monitor his pressures at home, keep a log before going to his PCP and also coming to me.  That way we can determine what his pressures truly are.  For now hold off on treatment.

## 2020-08-10 NOTE — Assessment & Plan Note (Addendum)
Given risk factors of hypertension hyper lipidemia and smoking, relatively low coronary calcium score.  We talked about his most concerning risk factor being smoking.  Smoking cessation counseling given.  Continue equivalent of 81 mg daily aspirin he is taking 162 mg twice daily. Continue statin. Monitor blood pressure.  He will get a blood pressure cuff in order to follow his pressures.  (Target systolic pressures between 110-135 mmHg, diastolic less than 85 mmHg)

## 2020-08-10 NOTE — Patient Instructions (Addendum)
Medication Instructions:   No change current medicines  *If you need a refill on your cardiac medications before your next appointment, please call your pharmacy*   Lab Work:  Continue to follow-up labs with your primary provider-should be due in March  If you have labs (blood work) drawn today and your tests are completely normal, you will receive your results only by: Marland Kitchen MyChart Message (if you have MyChart) OR . A paper copy in the mail If you have any lab test that is abnormal or we need to change your treatment, we will call you to review the results.   Testing/Procedures:  None for now   Follow-Up: At [facility], you and your health needs are our priority.  As part of our continuing mission to provide you with exceptional heart care, we have created designated Provider Care Teams.  These Care Teams include your primary Cardiologist (physician) and Advanced Practice Providers (APPs -  Physician Assistants and Nurse Practitioners) who all work together to provide you with the care you need, when you need it.  We recommend signing up for the patient portal called "MyChart".  Sign up information is provided on this After Visit Summary.  MyChart is used to connect with patients for Virtual Visits (Telemedicine).  Patients are able to view lab/test results, encounter notes, upcoming appointments, etc.  Non-urgent messages can be sent to your provider as well.   To learn more about what you can do with MyChart, go to [email or web address].    Your next appointment:   1 year(s)  The format for your next appointment:   In Person  Provider:   Bryan Lemma, MD   Other Instructions Keep working on cutting down the smoking -> consider using Nicorette gum to help with the oral fixation  Keep exercising  Look into getting a blood pressure cuff that you can keep track of your blood pressure (Omron is a good brand and an ARM cuff is preferred over a wrist cuff).  Maybe check it  every other day at different times the day and keep track of the log to pick to your primary provider.  Also have that with you for the month or 2 leading up to your next visit with me.

## 2020-08-10 NOTE — Assessment & Plan Note (Signed)
Most recent lipids were from March 2021.  LDL was down to 67 on current dose of atorvastatin.  Due to be followed up again in the next month or 2 when he sees his PCP.  Excellent control.  We talked with dietary modifications. No change

## 2020-09-12 ENCOUNTER — Other Ambulatory Visit: Payer: Self-pay

## 2020-09-13 ENCOUNTER — Ambulatory Visit (INDEPENDENT_AMBULATORY_CARE_PROVIDER_SITE_OTHER): Payer: Managed Care, Other (non HMO) | Admitting: Adult Health

## 2020-09-13 ENCOUNTER — Encounter: Payer: Self-pay | Admitting: Adult Health

## 2020-09-13 VITALS — BP 126/80 | HR 79 | Temp 97.8°F | Ht 65.0 in | Wt 125.2 lb

## 2020-09-13 DIAGNOSIS — Z0000 Encounter for general adult medical examination without abnormal findings: Secondary | ICD-10-CM

## 2020-09-13 DIAGNOSIS — E785 Hyperlipidemia, unspecified: Secondary | ICD-10-CM

## 2020-09-13 DIAGNOSIS — I2510 Atherosclerotic heart disease of native coronary artery without angina pectoris: Secondary | ICD-10-CM | POA: Diagnosis not present

## 2020-09-13 DIAGNOSIS — R030 Elevated blood-pressure reading, without diagnosis of hypertension: Secondary | ICD-10-CM

## 2020-09-13 DIAGNOSIS — F17200 Nicotine dependence, unspecified, uncomplicated: Secondary | ICD-10-CM

## 2020-09-13 DIAGNOSIS — N1832 Chronic kidney disease, stage 3b: Secondary | ICD-10-CM

## 2020-09-13 DIAGNOSIS — Z125 Encounter for screening for malignant neoplasm of prostate: Secondary | ICD-10-CM

## 2020-09-13 LAB — CBC WITH DIFFERENTIAL/PLATELET
Basophils Absolute: 0.1 10*3/uL (ref 0.0–0.1)
Basophils Relative: 0.6 % (ref 0.0–3.0)
Eosinophils Absolute: 0.4 10*3/uL (ref 0.0–0.7)
Eosinophils Relative: 4.8 % (ref 0.0–5.0)
HCT: 46.4 % (ref 39.0–52.0)
Hemoglobin: 15.6 g/dL (ref 13.0–17.0)
Lymphocytes Relative: 30.7 % (ref 12.0–46.0)
Lymphs Abs: 2.4 10*3/uL (ref 0.7–4.0)
MCHC: 33.5 g/dL (ref 30.0–36.0)
MCV: 90.5 fl (ref 78.0–100.0)
Monocytes Absolute: 0.7 10*3/uL (ref 0.1–1.0)
Monocytes Relative: 9.2 % (ref 3.0–12.0)
Neutro Abs: 4.4 10*3/uL (ref 1.4–7.7)
Neutrophils Relative %: 54.7 % (ref 43.0–77.0)
Platelets: 181 10*3/uL (ref 150.0–400.0)
RBC: 5.13 Mil/uL (ref 4.22–5.81)
RDW: 13 % (ref 11.5–15.5)
WBC: 8 10*3/uL (ref 4.0–10.5)

## 2020-09-13 LAB — COMPREHENSIVE METABOLIC PANEL
ALT: 23 U/L (ref 0–53)
AST: 19 U/L (ref 0–37)
Albumin: 4.2 g/dL (ref 3.5–5.2)
Alkaline Phosphatase: 74 U/L (ref 39–117)
BUN: 17 mg/dL (ref 6–23)
CO2: 28 mEq/L (ref 19–32)
Calcium: 9.5 mg/dL (ref 8.4–10.5)
Chloride: 104 mEq/L (ref 96–112)
Creatinine, Ser: 1.48 mg/dL (ref 0.40–1.50)
GFR: 50.06 mL/min — ABNORMAL LOW (ref >60.00)
Glucose, Bld: 81 mg/dL (ref 70–99)
Potassium: 3.9 mEq/L (ref 3.5–5.1)
Sodium: 140 mEq/L (ref 135–145)
Total Bilirubin: 1 mg/dL (ref 0.2–1.2)
Total Protein: 6.8 g/dL (ref 6.0–8.3)

## 2020-09-13 LAB — PSA: PSA: 0.67 ng/mL (ref 0.10–4.00)

## 2020-09-13 LAB — LIPID PANEL
Cholesterol: 126 mg/dL (ref 0–200)
HDL: 47.9 mg/dL (ref >39.00)
LDL Cholesterol: 61 mg/dL (ref 0–99)
NonHDL: 78.47
Total CHOL/HDL Ratio: 3
Triglycerides: 88 mg/dL (ref 0.0–149.0)
VLDL: 17.6 mg/dL (ref 0.0–40.0)

## 2020-09-13 LAB — HEMOGLOBIN A1C: Hgb A1c MFr Bld: 5.9 % (ref 4.6–6.5)

## 2020-09-13 LAB — TSH: TSH: 1.32 u[IU]/mL (ref 0.35–4.50)

## 2020-09-13 MED ORDER — ATORVASTATIN CALCIUM 40 MG PO TABS: 40.0000 mg | ORAL_TABLET | Freq: Every day | ORAL | 3 refills | Status: DC

## 2020-09-13 NOTE — Progress Notes (Signed)
Subjective:    Patient ID: Kurt Duncan, male    DOB: [birth year], 65 y.o.   MRN: [id number]  HPI  Patient presents for yearly preventative medicine examination. He is a pleasant 65 year old male who  has a past medical history of Coronary artery calcification seen on CT scan (01/2019), Current smoker, History of pyelonephritis, and Hyperlipidemia.  Hyperlipidemia/CAD - currently prescribed Lipitor 40 mg daily and one half tab of 325 mg aspirin every other day. He is followed by Cardiology on a routine basis  Lab Results  Component Value Date   CHOL 139 09/09/2019   HDL 48.40 09/09/2019   LDLCALC 67 09/09/2019   LDLDIRECT 151.3 08/02/2013   TRIG 117.0 09/09/2019   CHOLHDL 3 09/09/2019   Tobacco Use -continues to smoke but is working on quitting.  He understands he needs to quit but finds it hard as this has been a longstanding advice of his.  CKD stage 3 -is followed by nephrology on a routine basis.  Was last seen in October 2021 at which time all labs were stable.  He denies symptoms  Elevated Blood Pressure readings -he does have intermittent episodes of high blood pressure readings.  He has been asked to monitor his blood pressure at home and bring his log with him to office visit with myself or cardiology. He has been monitoring his BP at home and reports readings between 120-140/70-90 with most of his readings in the 120's/80's.  BP Readings from Last 3 Encounters:  09/13/20 126/80  07/20/20 (!) 148/90  09/09/19 130/82    All immunizations and health maintenance protocols were reviewed with the patient and needed orders were placed.  Appropriate screening laboratory values were ordered for the patient including screening of hyperlipidemia, renal function and hepatic function. If indicated by BPH, a PSA was ordered.  Medication reconciliation,  past medical history, social history, problem list and allergies were reviewed in detail with the patient  Goals were  established with regard to weight loss, exercise, and  diet in compliance with medications  He is up-to-date on routine colon cancer screening.  He has no acute complaints.   Review of Systems  Constitutional: Negative.   HENT: Negative.   Eyes: Negative.   Respiratory: Negative.   Cardiovascular: Negative.   Gastrointestinal: Negative.   Endocrine: Negative.   Genitourinary: Negative.   Musculoskeletal: Negative.   Skin: Negative.   Allergic/Immunologic: Negative.   Neurological: Negative.   Hematological: Negative.   Psychiatric/Behavioral: Negative.   All other systems reviewed and are negative.  Past Medical History:  Diagnosis Date  . Coronary artery calcification seen on CT scan 01/2019   Coronary calcium score 73  . Current smoker    One half PPD x50 years  . History of pyelonephritis   . Hyperlipidemia     Social History   Socioeconomic History  . Marital status: Married    Spouse name: Not on file  . Number of children: 3  . Years of education: Not on file  . Highest education level: Not on file  Occupational History  . Occupation: Oceanographer: Hospital doctor  Tobacco Use  . Smoking status: Current Every Day Smoker    Packs/day: 0.50    Years: 48.00    Pack years: 24.00    Types: Cigarettes  . Smokeless tobacco: Current User  . Tobacco comment: Encouaged patient to quit. He is interested and he states that his smoking is a "pasttime"  Substance  and Sexual Activity  . Alcohol use: No    Alcohol/week: 0.0 standard drinks  . Drug use: No  . Sexual activity: Yes  Other Topics Concern  . 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 COLONOSCOPY  2012   Normal    Family History  Problem Relation Age of Onset  . Hypertension Mother   . Cancer Father 45       brain    No Known Allergies  Current Outpatient Medications on File Prior to Visit  Medication Sig Dispense Refill  . aspirin EC 325 MG tablet Take 1/2 tablet of 325 mg every other day 30 tablet 0  . atorvastatin (LIPITOR) 40 MG tablet Take 1 tablet (40 mg total) by mouth daily. 90 tablet 3   No current facility-administered medications on file prior to visit.    BP 126/80   Pulse 79   Temp 97.8 F (36.6 C) (Oral)   Ht 5\' 5"  (1.651 m)   Wt 125 lb 4 oz (56.8 kg)   SpO2 98%   BMI 20.84 kg/m       Objective:   Physical Exam Vitals and nursing note reviewed.  Constitutional:      General: He is not in acute distress.    Appearance: Normal appearance. He is well-developed and normal weight.  HENT:     Head: Normocephalic and atraumatic.     Right Ear: Tympanic membrane, ear canal and external ear normal. There is no impacted cerumen.     Left Ear: Tympanic membrane, ear canal and external ear normal. There is no impacted cerumen.     Nose: Nose normal. No congestion or rhinorrhea.     Mouth/Throat:     Mouth: Mucous membranes are moist.     Pharynx: Oropharynx is clear. No oropharyngeal exudate or posterior oropharyngeal erythema.  Eyes:     General:        Right eye: No discharge.        Left eye: No discharge.     Extraocular Movements: Extraocular movements intact.     Conjunctiva/sclera: Conjunctivae normal.     Pupils: Pupils are equal, round, and reactive to light.  Neck:     Vascular: No carotid bruit.     Trachea: No tracheal deviation.  Cardiovascular:     Rate and Rhythm: Normal rate and regular rhythm.     Pulses: Normal pulses.     Heart sounds:  Normal heart sounds. No murmur heard. No friction rub. No gallop.   Pulmonary:     Effort: Pulmonary effort is normal. No respiratory distress.     Breath sounds: Normal breath sounds. No stridor. No wheezing, rhonchi or rales.  Chest:     Chest wall: No tenderness.  Abdominal:     General: Bowel sounds are normal. There is no distension.     Palpations: Abdomen is soft. There is no mass.  Tenderness: There is no abdominal tenderness. There is no right CVA tenderness, left CVA tenderness, guarding or rebound.     Hernia: No hernia is present.  Musculoskeletal:        General: No swelling, tenderness, deformity or signs of injury. Normal range of motion.     Right lower leg: No edema.     Left lower leg: No edema.  Lymphadenopathy:     Cervical: No cervical adenopathy.  Skin:    General: Skin is warm and dry.     Capillary Refill: Capillary refill takes less than 2 seconds.     Coloration: Skin is not jaundiced or pale.     Findings: No bruising, erythema, lesion or rash.  Neurological:     General: No focal deficit present.     Mental Status: He is alert and oriented to person, place, and time.     Cranial Nerves: No cranial nerve deficit.     Sensory: No sensory deficit.     Motor: No weakness.     Coordination: Coordination normal.     Gait: Gait normal.     Deep Tendon Reflexes: Reflexes normal.  Psychiatric:        Mood and Affect: Mood normal.        Behavior: Behavior normal.        Thought Content: Thought content normal.        Judgment: Judgment normal.       Assessment & Plan:  1. Routine general medical examination at a health care facility - Follow up in one year or sooner if needed - work on lifestyle modifications  - Needs to quit smoking  - CBC with Differential/Platelet; Future - Comprehensive metabolic panel; Future - Hemoglobin A1c; Future - Lipid panel; Future - TSH; Future  2. Hyperlipidemia, unspecified hyperlipidemia type - well controlled  with lipitor. No change in dosing at this time  - CBC with Differential/Platelet; Future - Comprehensive metabolic panel; Future - Hemoglobin A1c; Future - Lipid panel; Future - TSH; Future - atorvastatin (LIPITOR) 40 MG tablet; Take 1 tablet (40 mg total) by mouth daily.  Dispense: 90 tablet; Refill: 3  3. Tobacco use disorder - Encouraged to quit   4. Prostate cancer screening  - PSA; Future  5. Coronary artery disease involving native heart without angina pectoris, unspecified vessel or lesion type - Follow up with Cardiology as directed - Continue ASA and Lipitor  - CBC with Differential/Platelet; Future - Comprehensive metabolic panel; Future - Hemoglobin A1c; Future - Lipid panel; Future - TSH; Future - atorvastatin (LIPITOR) 40 MG tablet; Take 1 tablet (40 mg total) by mouth daily.  Dispense: 90 tablet; Refill: 3  6. Elevated blood pressure reading - BP at goal today. Continue to monitor  - CBC with Differential/Platelet; Future - Comprehensive metabolic panel; Future - Hemoglobin A1c; Future - Lipid panel; Future - TSH; Future  7. Stage 3b chronic kidney disease (HCC) - Follow up with Nephrology as directed - CBC with Differential/Platelet; Future - Comprehensive metabolic panel; Future - Hemoglobin A1c; Future - Lipid panel; Future - TSH; Future  Shirline Frees, NP

## 2021-02-13 ENCOUNTER — Other Ambulatory Visit: Payer: Self-pay

## 2021-02-13 ENCOUNTER — Ambulatory Visit (INDEPENDENT_AMBULATORY_CARE_PROVIDER_SITE_OTHER)
Admission: RE | Admit: 2021-02-13 | Discharge: 2021-02-13 | Disposition: A | Discharge status: Home / Self Care | Payer: Managed Care, Other (non HMO) | Source: Ambulatory Visit | Attending: Cardiovascular Disease | Admitting: Cardiovascular Disease

## 2021-02-13 DIAGNOSIS — F17210 Nicotine dependence, cigarettes, uncomplicated: Secondary | ICD-10-CM | POA: Diagnosis not present

## 2021-03-01 NOTE — Progress Notes (Signed)
Please call patient and let them  know their  low dose Ct was read as a Lung RADS 2: nodules that are benign in appearance and behavior with a very low likelihood of becoming a clinically active cancer due to size or lack of growth. Recommendation per radiology is for a repeat LDCT in 12 months. .Please let them  know we will order and schedule their  annual screening scan for 01/2022. Please let them  know there was notation of CAD on their  scan.  Please remind the patient  that this is a non-gated exam therefore degree or severity of disease  cannot be determined. Please have them  follow up with their PCP regarding potential risk factor modification, dietary therapy or pharmacologic therapy if clinically indicated. Pt.  is  currently on statin therapy. Please place order for annual  screening scan for  01/2022 and fax results to PCP. Thanks so much.  + CAD, on statin, followed by cards   

## 2021-03-02 ENCOUNTER — Encounter: Payer: Self-pay | Admitting: *Deleted

## 2021-03-02 DIAGNOSIS — F17210 Nicotine dependence, cigarettes, uncomplicated: Secondary | ICD-10-CM

## 2021-04-03 ENCOUNTER — Other Ambulatory Visit: Payer: Self-pay

## 2021-04-03 ENCOUNTER — Ambulatory Visit: Payer: Managed Care, Other (non HMO) | Admitting: Internal Medicine

## 2021-04-03 ENCOUNTER — Encounter: Payer: Self-pay | Admitting: Internal Medicine

## 2021-04-03 VITALS — BP 110/78 | HR 97 | Temp 98.1°F | Wt 124.2 lb

## 2021-04-03 DIAGNOSIS — M545 Low back pain, unspecified: Secondary | ICD-10-CM | POA: Diagnosis not present

## 2021-04-03 NOTE — Progress Notes (Signed)
Acute office Visit     This visit occurred during the SARS-CoV-2 public health emergency.  Safety protocols were in place, including screening questions prior to the visit, additional usage of staff PPE, and extensive cleaning of exam room while observing appropriate contact time as indicated for disinfecting solutions.    CC/Reason for Visit: Discuss low back pain  HPI: Kurt Duncan is a 65 y.o. male who is coming in today for the above mentioned reasons.  He was doing some heavy lifting of a filing cabinet during a move last week.  Afterwards he felt some lower back pain, although it has gotten a little better it has persisted over the course of the past week so he decides to come in today for evaluation.  He has no urinary symptoms, no radiculopathy, no bowel or bladder incontinence, no saddle anesthesia.  Past Medical/Surgical History: Past Medical History:  Diagnosis Date   Coronary artery calcification seen on CT scan 01/2019   Coronary calcium score 73   Current smoker    One half PPD x50 years   History of pyelonephritis    Hyperlipidemia     Past Surgical History:  Procedure Laterality Date   COLONOSCOPY  2012   Normal    Social History:  reports that he has been smoking cigarettes. He has a 24.00 pack-year smoking history. He uses smokeless tobacco. He reports that he does not drink alcohol and does not use drugs.  Allergies: No Known Allergies  Family History:  Family History  Problem Relation Age of Onset   Hypertension Mother    Cancer Father 19       brain     Current Outpatient Medications:    aspirin EC 325 MG tablet, Take 1/2 tablet of 325 mg every other day, Disp: 30 tablet, Rfl: 0   atorvastatin (LIPITOR) 40 MG tablet, Take 1 tablet (40 mg total) by mouth daily., Disp: 90 tablet, Rfl: 3  Review of Systems:  Constitutional: Denies fever, chills, diaphoresis, appetite change and fatigue.  HEENT: Denies photophobia, eye pain, redness,  hearing loss, ear pain, congestion, sore throat, rhinorrhea, sneezing, mouth sores, trouble swallowing, neck pain, neck stiffness and tinnitus.   Respiratory: Denies SOB, DOE, cough, chest tightness,  and wheezing.   Cardiovascular: Denies chest pain, palpitations and leg swelling.  Gastrointestinal: Denies nausea, vomiting, abdominal pain, diarrhea, constipation, blood in stool and abdominal distention.  Genitourinary: Denies dysuria, urgency, frequency, hematuria, flank pain and difficulty urinating.  Endocrine: Denies: hot or cold intolerance, sweats, changes in hair or nails, polyuria, polydipsia. Musculoskeletal: Denies  joint swelling, arthralgias and gait problem.  Skin: Denies pallor, rash and wound.  Neurological: Denies dizziness, seizures, syncope, weakness, light-headedness, numbness and headaches.  Hematological: Denies adenopathy. Easy bruising, personal or family bleeding history  Psychiatric/Behavioral: Denies suicidal ideation, mood changes, confusion, nervousness, sleep disturbance and agitation    Physical Exam: Vitals:   04/03/21 0727  BP: 110/78  Pulse: 97  Temp: 98.1 F (36.7 C)  TempSrc: Oral  SpO2: 97%  Weight: 124 lb 3.2 oz (56.3 kg)    Body mass index is 20.67 kg/m.   Constitutional: NAD, calm, comfortable Eyes: PERRL, lids and conjunctivae normal, wears corrective lenses ENMT: Mucous membranes are moist.  Musculoskeletal: no clubbing / cyanosis. No joint deformity upper and lower extremities. Good ROM, no contractures. Normal muscle tone.  Neurologic: Grossly intact and nonfocal Psychiatric: Normal judgment and insight. Alert and oriented x 3. Normal mood.    Impression and Plan:  Acute bilateral low back pain without sciatica -Suspect this is simply musculoskeletal pain given lack of red flag signs/symptoms. -Advised icing, as needed NSAIDs, back stretches, local massage therapy. -If no improvement in 3 to 4 weeks, can consider referral to  physical therapy.  Time spent: 21 minutes reviewing chart, interviewing and examining patient and formulating plan of care.    Chaya Jan, MD [facility]

## 2021-05-28 ENCOUNTER — Telehealth: Payer: Self-pay | Admitting: Adult Health

## 2021-05-28 NOTE — Telephone Encounter (Signed)
Patient calling in with respiratory symptoms: Shortness of breath, chest pain, palpitations or other red words send to Triage  Does the patient have a fever over 100, cough, congestion, sore throat, runny nose, lost of taste/smell within the last 5 days (please list symptoms that patient has)? Fever, sore throat  Have you tested for Covid in the last 5 days? Yes   If yes, was it positive []  OR negative [x] ? If positive in the last 5 days, please schedule virtual visit now. If negative, schedule for an in person OV with the next available provider if PCP has no openings. Please also let patient know they will be tested again (follow the script below)  "you will have to arrive prior to your appt time to be Covid tested. Please park in back of office at the cone & call [phone] to let the staff know you have arrived. A staff member will meet you at your car to do a rapid covid test. Once the test has resulted you will be notified by phone of your results to determine if appt will remain an in person visit or be converted to a virtual/phone visit. If you arrive less than before your appt time, your visit will be automatically converted to virtual & any recommended testing will happen AFTER the visit."   THINGS TO REMEMBER  If no availability for virtual visit in office,  please schedule another [place] office  If no availability at another  office, please instruct patient that they can schedule an evisit or virtual visit through their mychart account. Visits up to 8pm  patients can be seen in office 5 days after positive COVID test

## 2021-05-29 ENCOUNTER — Ambulatory Visit: Payer: Managed Care, Other (non HMO) | Admitting: Family Medicine

## 2021-05-29 VITALS — BP 140/80 | HR 67 | Temp 97.9°F | Wt 124.8 lb

## 2021-05-29 DIAGNOSIS — J069 Acute upper respiratory infection, unspecified: Secondary | ICD-10-CM

## 2021-05-29 DIAGNOSIS — J029 Acute pharyngitis, unspecified: Secondary | ICD-10-CM | POA: Diagnosis not present

## 2021-05-29 LAB — POC COVID19 BINAXNOW: SARS Coronavirus 2 Ag: NEGATIVE

## 2021-05-29 LAB — POCT RAPID STREP A (OFFICE): Rapid Strep A Screen: NEGATIVE

## 2021-05-29 NOTE — Progress Notes (Signed)
Established Patient Office Visit  Subjective:  Patient ID: Kurt Duncan, male    DOB: Feb 05, 1956  Age: 65 y.o. MRN: [id number]  CC:  Chief Complaint  Patient presents with   Cough    Cough, congestion, sore throat,     HPI Kurt Duncan presents for acute upper respiratory symptoms.  Onset Saturday.  He noticed some sore throat and some nasal congestion.  He was around a 34-year-old granddaughter Thanksgiving day and she apparently had some viral symptoms.  No definite fever.  Patient does have history of strep pharyngitis in the past and was concerned about that.  Has used some Tylenol for his sore throat.  Minimal cough.  Denies any nausea, vomiting, or diarrhea.  Past Medical History:  Diagnosis Date   Coronary artery calcification seen on CT scan 01/2019   Coronary calcium score 73   Current smoker    One half PPD x50 years   History of pyelonephritis    Hyperlipidemia     Past Surgical History:  Procedure Laterality Date   COLONOSCOPY  2012   Normal    Family History  Problem Relation Age of Onset   Hypertension Mother    Cancer Father 80       brain    Social History   Socioeconomic History   Marital status: Married    Spouse name: Not on file   Number of children: 3   Years of education: Not on file   Highest education level: Bachelor's degree (e.g., BA, AB, BS)  Occupational History   Occupation: Oceanographer: Hospital doctor  Tobacco Use   Smoking status: Every Day    Packs/day: 0.50    Years: 48.00    Pack years: 24.00    Types: Cigarettes   Smokeless tobacco: Current   Tobacco comments:    Encouaged patient to quit. 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, international aid and watch TV   Married 30 years   3 daughters ages 26, 36, 68   Social Determinants of Corporate investment banker Strain: Low Risk    Difficulty of Paying Living Expenses: Not hard at all  Food Insecurity: No Food Insecurity   Worried About Programme researcher, broadcasting/film/video in the Last Year: Never true   Barista in the Last Year: Never true  Transportation Needs: No Transportation Needs   Lack of Transportation (Medical): No   Lack of Transportation (Non-Medical): No  Physical Activity: Insufficiently Active   Days of Exercise per Week: 1 day   Minutes of Exercise per Session: 10 min  Stress: No Stress Concern Present   Feeling of Stress : Not at all  Social Connections: Moderately Integrated   Frequency of Communication with Friends and Family: Twice a week   Frequency of Social Gatherings with Friends and Family: Twice a week   Attends Religious Services: More than 4 times per year   Active Member of [employer] or Organizations: No   Attends Engineer, structural: Not on file   Marital Status: Married  Catering manager Violence: Not on file  Outpatient Medications Prior to Visit  Medication Sig Dispense Refill   aspirin EC 325 MG tablet Take 1/2 tablet of 325 mg every other day 30 tablet 0   atorvastatin (LIPITOR) 40 MG tablet Take 1 tablet (40 mg total) by mouth daily. 90 tablet 3   No facility-administered medications prior to visit.    No Known Allergies  ROS Review of Systems  Constitutional:  Negative for chills and fever.  HENT:  Positive for congestion and sore throat.   Respiratory:  Negative for shortness of breath.   Cardiovascular:  Negative for chest pain.  Gastrointestinal:  Negative for diarrhea, nausea and vomiting.     Objective:    Physical Exam Vitals reviewed.  Constitutional:      Appearance: Normal appearance.  HENT:     Right Ear: Tympanic membrane normal.     Left Ear: Tympanic membrane normal.      Mouth/Throat:     Comments: He does have some mild posterior pharynx erythema.  No exudate visible. Neck:     Comments: Has some minimal cervical adenopathy right greater than left Cardiovascular:     Rate and Rhythm: Normal rate and regular rhythm.  Pulmonary:     Effort: Pulmonary effort is normal.     Breath sounds: Normal breath sounds.  Musculoskeletal:     Cervical back: Neck supple.  Neurological:     Mental Status: He is alert.    BP 140/80 (BP Location: Left Arm, Patient Position: Sitting, Cuff Size: Normal)   Pulse 67   Temp 97.9 F (36.6 C) (Oral)   Wt 124 lb 12.8 oz (56.6 kg)   SpO2 98%   BMI 20.77 kg/m  Wt Readings from Last 3 Encounters:  05/29/21 124 lb 12.8 oz (56.6 kg)  04/03/21 124 lb 3.2 oz (56.3 kg)  09/13/20 125 lb 4 oz (56.8 kg)     Health Maintenance Due  Topic Date Due   Pneumococcal Vaccine 61-50 Years old (1 - PCV) Never done    There are no preventive care reminders to display for this patient.  Lab Results  Component Value Date   TSH 1.32 09/13/2020   Lab Results  Component Value Date   WBC 8.0 09/13/2020   HGB 15.6 09/13/2020   HCT 46.4 09/13/2020   MCV 90.5 09/13/2020   PLT 181.0 09/13/2020   Lab Results  Component Value Date   NA 140 09/13/2020   K 3.9 09/13/2020   CO2 28 09/13/2020   GLUCOSE 81 09/13/2020   BUN 17 09/13/2020   CREATININE 1.48 09/13/2020   BILITOT 1.0 09/13/2020   ALKPHOS 74 09/13/2020   AST 19 09/13/2020   ALT 23 09/13/2020   PROT 6.8 09/13/2020   ALBUMIN 4.2 09/13/2020   CALCIUM 9.5 09/13/2020   GFR 50.06 (L) 09/13/2020   Lab Results  Component Value Date   CHOL 126 09/13/2020   Lab Results  Component Value Date   HDL 47.90 09/13/2020   Lab Results  Component Value Date   LDLCALC 61 09/13/2020   Lab Results  Component Value Date   TRIG 88.0 09/13/2020   Lab Results  Component Value Date   CHOLHDL 3 09/13/2020   Lab Results  Component Value Date   HGBA1C 5.9 09/13/2020       Assessment & Plan:   Problem List Items Addressed This Visit   None Visit Diagnoses     Sore throat    -  Primary   Relevant Orders   POC  COVID-19 (Completed)   POC Rapid Strep A (Completed)      Acute pharyngitis symptoms.  Rapid strep negative.  COVID-negative.  We elected not to test for influenza since he is greater than 48 hours in.  Suspect other viral URI.  -Recommend plenty of fluids and consider over-the-counter Motrin or Aleve for sore throat symptoms.  Follow-up for any persistent or worsening symptoms  No orders of the defined types were placed in this encounter.   Follow-up: No follow-ups on file.    Evelena Peat, MD

## 2021-05-29 NOTE — Patient Instructions (Signed)
Strep and Covid screen negative  Consider Over the counter Motrin or Aleve for sore throat symptoms.

## 2021-09-10 ENCOUNTER — Other Ambulatory Visit: Payer: Self-pay | Admitting: Adult Health

## 2021-09-10 DIAGNOSIS — E785 Hyperlipidemia, unspecified: Secondary | ICD-10-CM

## 2021-09-10 DIAGNOSIS — I2510 Atherosclerotic heart disease of native coronary artery without angina pectoris: Secondary | ICD-10-CM

## 2021-09-11 NOTE — Telephone Encounter (Signed)
Pt has been scheduled 30 days called ind. ?

## 2021-09-11 NOTE — Telephone Encounter (Signed)
Patient need to schedule an CPE for more refills. 

## 2021-09-26 ENCOUNTER — Encounter: Payer: Self-pay | Admitting: Adult Health

## 2021-09-26 ENCOUNTER — Ambulatory Visit (INDEPENDENT_AMBULATORY_CARE_PROVIDER_SITE_OTHER): Payer: Managed Care, Other (non HMO) | Admitting: Adult Health

## 2021-09-26 VITALS — BP 128/88 | HR 70 | Temp 98.5°F | Ht 65.0 in | Wt 120.0 lb

## 2021-09-26 DIAGNOSIS — I2510 Atherosclerotic heart disease of native coronary artery without angina pectoris: Secondary | ICD-10-CM

## 2021-09-26 DIAGNOSIS — F17200 Nicotine dependence, unspecified, uncomplicated: Secondary | ICD-10-CM

## 2021-09-26 DIAGNOSIS — E785 Hyperlipidemia, unspecified: Secondary | ICD-10-CM

## 2021-09-26 DIAGNOSIS — Z125 Encounter for screening for malignant neoplasm of prostate: Secondary | ICD-10-CM | POA: Diagnosis not present

## 2021-09-26 DIAGNOSIS — N1832 Chronic kidney disease, stage 3b: Secondary | ICD-10-CM

## 2021-09-26 DIAGNOSIS — Z0000 Encounter for general adult medical examination without abnormal findings: Secondary | ICD-10-CM

## 2021-09-26 DIAGNOSIS — Z23 Encounter for immunization: Secondary | ICD-10-CM

## 2021-09-26 LAB — CBC WITH DIFFERENTIAL/PLATELET
Basophils Absolute: 0.1 10*3/uL (ref 0.0–0.1)
Basophils Relative: 1.1 % (ref 0.0–3.0)
Eosinophils Absolute: 0.3 10*3/uL (ref 0.0–0.7)
Eosinophils Relative: 4.7 % (ref 0.0–5.0)
HCT: 47.1 % (ref 39.0–52.0)
Hemoglobin: 15.4 g/dL (ref 13.0–17.0)
Lymphocytes Relative: 29.3 % (ref 12.0–46.0)
Lymphs Abs: 2.1 10*3/uL (ref 0.7–4.0)
MCHC: 32.8 g/dL (ref 30.0–36.0)
MCV: 91.8 fl (ref 78.0–100.0)
Monocytes Absolute: 0.7 10*3/uL (ref 0.1–1.0)
Monocytes Relative: 10.2 % (ref 3.0–12.0)
Neutro Abs: 3.8 10*3/uL (ref 1.4–7.7)
Neutrophils Relative %: 54.7 % (ref 43.0–77.0)
Platelets: 202 10*3/uL (ref 150.0–400.0)
RBC: 5.13 Mil/uL (ref 4.22–5.81)
RDW: 13.9 % (ref 11.5–15.5)
WBC: 7 10*3/uL (ref 4.0–10.5)

## 2021-09-26 LAB — LIPID PANEL
Cholesterol: 130 mg/dL (ref 0–200)
HDL: 49.4 mg/dL (ref >39.00)
LDL Cholesterol: 61 mg/dL (ref 0–99)
NonHDL: 80.89
Total CHOL/HDL Ratio: 3
Triglycerides: 101 mg/dL (ref 0.0–149.0)
VLDL: 20.2 mg/dL (ref 0.0–40.0)

## 2021-09-26 LAB — COMPREHENSIVE METABOLIC PANEL
ALT: 18 U/L (ref 0–53)
AST: 16 U/L (ref 0–37)
Albumin: 4.3 g/dL (ref 3.5–5.2)
Alkaline Phosphatase: 78 U/L (ref 39–117)
BUN: 14 mg/dL (ref 6–23)
CO2: 30 mEq/L (ref 19–32)
Calcium: 9.7 mg/dL (ref 8.4–10.5)
Chloride: 107 mEq/L (ref 96–112)
Creatinine, Ser: 1.44 mg/dL (ref 0.40–1.50)
GFR: 51.36 mL/min — ABNORMAL LOW (ref >60.00)
Glucose, Bld: 88 mg/dL (ref 70–99)
Potassium: 4.3 mEq/L (ref 3.5–5.1)
Sodium: 142 mEq/L (ref 135–145)
Total Bilirubin: 0.9 mg/dL (ref 0.2–1.2)
Total Protein: 6.7 g/dL (ref 6.0–8.3)

## 2021-09-26 LAB — TSH: TSH: 1.03 u[IU]/mL (ref 0.35–5.50)

## 2021-09-26 LAB — PSA: PSA: 0.5 ng/mL (ref 0.10–4.00)

## 2021-09-26 NOTE — Progress Notes (Signed)
? ?Subjective:  ? ? Patient ID: Kurt Duncan, male    DOB: 1956/06/17, 66 y.o.   MRN: [id number] ? ?HPI ?Patient presents for yearly preventative medicine examination. He is a pleasant 66 year old male who  has a past medical history of Coronary artery calcification seen on CT scan (01/2019), Current smoker, History of pyelonephritis, and Hyperlipidemia. ? ?Hyperlipidemia/CAD -currently prescribed Lipitor 40 mg daily and 180 mg every other day.  He is followed by cardiology ?Today's symptoms.  Denies chest pain, DOE, palpitations, sinus, or lightheadedness ?Lab Results  ?Component Value Date  ? CHOL 126 09/13/2020  ? HDL 47.90 09/13/2020  ? [place]wey 61 09/13/2020  ? LDLDIRECT 151.3 08/02/2013  ? TRIG 88.0 09/13/2020  ? CHOLHDL 3 09/13/2020  ? ?CKD stage III-followed by nephrology on a routine basis.  He was last seen in May 2022 at which time all labs are stable. ? ?Tobacco use-continues to smoke half a pack a day.  Has not really motivated to quit smoking. He has routine CT Chest lung cancer screening done.  ? ?All immunizations and health maintenance protocols were reviewed with the patient and needed orders were placed. ? ?Appropriate screening laboratory values were ordered for the patient including screening of hyperlipidemia, renal function and hepatic function. ?If indicated by BPH, a PSA was ordered. ? ?Medication reconciliation,  past medical history, social history, problem list and allergies were reviewed in detail with the patient ? ?Goals were established with regard to weight loss, exercise, and  diet in compliance with medications ?Wt Readings from Last 3 Encounters:  ?09/26/21 120 lb (54.4 kg)  ?05/29/21 124 lb 12.8 oz (56.6 kg)  ?04/03/21 124 lb 3.2 oz (56.3 kg)  ? ?He is up to date on routine colon cancer screening  ? ?Review of Systems  ?Constitutional: Negative.   ?HENT: Negative.    ?Eyes: Negative.   ?Respiratory: Negative.    ?Cardiovascular: Negative.   ?Gastrointestinal: Negative.    ?Endocrine: Negative.   ?Genitourinary: Negative.   ?Musculoskeletal: Negative.   ?Skin: Negative.   ?Allergic/Immunologic: Negative.   ?Neurological: Negative.   ?Hematological: Negative.   ?Psychiatric/Behavioral: Negative.    ?All other systems reviewed and are negative. ? ?Past Medical History:  ?Diagnosis Date  ? Coronary artery calcification seen on CT scan 01/2019  ? Coronary calcium score 73  ? Current smoker   ? One half PPD x50 years  ? History of pyelonephritis   ? Hyperlipidemia   ? ? ?Social History  ? ?Socioeconomic History  ? Marital status: Married  ?  Spouse name: Not on file  ? Number of children: 3  ? Years of education: Not on file  ? Highest education level: Bachelor's degree (e.g., BA, AB, BS)  ?Occupational History  ? Occupation: Land  ?  Employer: [employer]  ?Tobacco Use  ? Smoking status: Every Day  ?  Packs/day: 0.50  ?  Years: 48.00  ?  Pack years: 24.00  ?  Types: Cigarettes  ? Smokeless tobacco: Current  ? Tobacco comments:  ?  Encouaged patient to quit. He is interested and he states that his smoking is a "pasttime"  ?Substance and Sexual Activity  ? Alcohol use: No  ?  Alcohol/week: 0.0 standard drinks  ? Drug use: No  ? Sexual activity: Yes  ?Other Topics Concern  ? Not on file  ?Social History Narrative  ? Works as a Chief Financial Officer  ? Likes to "sleep" and rest because he is a "workaholic"  ?  Computer and watch TV  ? Married 30 years  ? 3 daughters ages 71, 36, 40  ? ?Social Determinants of Health  ? ?Financial Resource Strain: Low Risk   ? Difficulty of Paying Living Expenses: Not hard at all  ?Food Insecurity: No Food Insecurity  ? Worried About Charity fundraiser in the Last Year: Never true  ? Ran Out of Food in the Last Year: Never true  ?Transportation Needs: No Transportation Needs  ? Lack of Transportation (Medical): No  ? Lack of Transportation (Non-Medical): No  ?Physical Activity: Insufficiently Active  ? Days of Exercise per Week: 1 day  ? Minutes of  Exercise per Session: 10 min  ?Stress: No Stress Concern Present  ? Feeling of Stress : Not at all  ?Social Connections: Moderately Integrated  ? Frequency of Communication with Friends and Family: Twice a week  ? Frequency of Social Gatherings with Friends and Family: Twice a week  ? Attends Religious Services: More than 4 times per year  ? Active Member of Clubs or Organizations: No  ? Attends Archivist Meetings: Not on file  ? Marital Status: Married  ?Intimate Partner Violence: Not on file  ? ? ?Past Surgical History:  ?Procedure Laterality Date  ? COLONOSCOPY  2012  ? Normal  ? ? ?Family History  ?Problem Relation Age of Onset  ? Hypertension Mother   ? Cancer Father 58  ?     brain  ? ? ?No Known Allergies ? ?Current Outpatient Medications on File Prior to Visit  ?Medication Sig Dispense Refill  ? aspirin EC 325 MG tablet Take 1/2 tablet of 325 mg every other day 30 tablet 0  ? atorvastatin (LIPITOR) 40 MG tablet TAKE 1 TABLET BY MOUTH EVERY DAY 30 tablet 0  ? triamcinolone ointment (KENALOG) 0.1 % Apply topically.    ? ?No current facility-administered medications on file prior to visit.  ? ? ?BP 128/88   Pulse 70   Temp 98.5 ?F (36.9 ?C) (Oral)   Ht 5\' 5"  (1.651 m)   Wt 120 lb (54.4 kg)   SpO2 99%   BMI 19.97 kg/m?  ? ? ?   ?Objective:  ? Physical Exam ?Vitals and nursing note reviewed.  ?Constitutional:   ?   General: He is not in acute distress. ?   Appearance: Normal appearance. He is well-developed and normal weight.  ?HENT:  ?   Head: Normocephalic and atraumatic.  ?   Right Ear: Tympanic membrane, ear canal and external ear normal. There is no impacted cerumen.  ?   Left Ear: Tympanic membrane, ear canal and external ear normal. There is no impacted cerumen.  ?   Nose: Nose normal. No congestion or rhinorrhea.  ?   Mouth/Throat:  ?   Mouth: Mucous membranes are moist.  ?   Pharynx: Oropharynx is clear. No oropharyngeal exudate or posterior oropharyngeal erythema.  ?Eyes:  ?   General:      ?   Right eye: No discharge.     ?   Left eye: No discharge.  ?   Extraocular Movements: Extraocular movements intact.  ?   Conjunctiva/sclera: Conjunctivae normal.  ?   Pupils: Pupils are equal, round, and reactive to light.  ?Neck:  ?   Vascular: No carotid bruit.  ?   Trachea: No tracheal deviation.  ?Cardiovascular:  ?   Rate and Rhythm: Normal rate and regular rhythm.  ?   Pulses: Normal pulses.  ?  Heart sounds: Normal heart sounds. No murmur heard. ?  No friction rub. No gallop.  ?Pulmonary:  ?   Effort: Pulmonary effort is normal. No respiratory distress.  ?   Breath sounds: Normal breath sounds. No stridor. No wheezing, rhonchi or rales.  ?Chest:  ?   Chest wall: No tenderness.  ?Abdominal:  ?   General: Bowel sounds are normal. There is no distension.  ?   Palpations: Abdomen is soft. There is no mass.  ?   Tenderness: There is no abdominal tenderness. There is no right CVA tenderness, left CVA tenderness, guarding or rebound.  ?   Hernia: No hernia is present.  ?Musculoskeletal:     ?   General: No swelling, tenderness, deformity or signs of injury. Normal range of motion.  ?   Right lower leg: No edema.  ?   Left lower leg: No edema.  ?Lymphadenopathy:  ?   Cervical: No cervical adenopathy.  ?Skin: ?   General: Skin is warm and dry.  ?   Capillary Refill: Capillary refill takes less than 2 seconds.  ?   Coloration: Skin is not jaundiced or pale.  ?   Findings: No bruising, erythema, lesion or rash.  ?Neurological:  ?   General: No focal deficit present.  ?   Mental Status: He is alert and oriented to person, place, and time.  ?   Cranial Nerves: No cranial nerve deficit.  ?   Sensory: No sensory deficit.  ?   Motor: No weakness.  ?   Coordination: Coordination normal.  ?   Gait: Gait normal.  ?   Deep Tendon Reflexes: Reflexes normal.  ?Psychiatric:     ?   Mood and Affect: Mood normal.     ?   Behavior: Behavior normal.     ?   Thought Content: Thought content normal.     ?   Judgment: Judgment  normal.  ? ?   ?Assessment & Plan:  ?1. Routine general medical examination at a health care facility ?- needs to quit smoking  ?- Follow up in one year or sooner if needed ?- Work on diet and exercise  ?- CBC with Diffe

## 2021-09-26 NOTE — Patient Instructions (Addendum)
It was great seeing you today  ? ?We will follow up with you regarding your lab work  ? ?Please let me know if you need anything  ? ?Please follow up in 3 months for your second shingles vaccination  ?

## 2021-10-10 ENCOUNTER — Other Ambulatory Visit: Payer: Self-pay | Admitting: Adult Health

## 2021-10-10 DIAGNOSIS — I2510 Atherosclerotic heart disease of native coronary artery without angina pectoris: Secondary | ICD-10-CM

## 2021-10-10 DIAGNOSIS — E785 Hyperlipidemia, unspecified: Secondary | ICD-10-CM

## 2021-11-26 ENCOUNTER — Ambulatory Visit: Payer: Managed Care, Other (non HMO)

## 2021-11-27 ENCOUNTER — Ambulatory Visit (INDEPENDENT_AMBULATORY_CARE_PROVIDER_SITE_OTHER): Payer: Managed Care, Other (non HMO)

## 2021-11-27 DIAGNOSIS — Z23 Encounter for immunization: Secondary | ICD-10-CM | POA: Diagnosis not present

## 2021-11-27 NOTE — Progress Notes (Signed)
Pt came in for second shingles, tolerated well.

## 2022-02-13 ENCOUNTER — Ambulatory Visit
Admission: RE | Admit: 2022-02-13 | Discharge: 2022-02-13 | Disposition: A | Discharge status: Home / Self Care | Payer: Managed Care, Other (non HMO) | Source: Ambulatory Visit | Attending: Acute Care | Admitting: Acute Care

## 2022-02-13 ENCOUNTER — Ambulatory Visit (HOSPITAL_BASED_OUTPATIENT_CLINIC_OR_DEPARTMENT_OTHER): Payer: Managed Care, Other (non HMO)

## 2022-02-13 DIAGNOSIS — F17210 Nicotine dependence, cigarettes, uncomplicated: Secondary | ICD-10-CM

## 2022-02-15 ENCOUNTER — Other Ambulatory Visit: Payer: Self-pay

## 2022-02-15 DIAGNOSIS — Z87891 Personal history of nicotine dependence: Secondary | ICD-10-CM

## 2022-02-15 DIAGNOSIS — F17210 Nicotine dependence, cigarettes, uncomplicated: Secondary | ICD-10-CM

## 2022-02-15 DIAGNOSIS — Z122 Encounter for screening for malignant neoplasm of respiratory organs: Secondary | ICD-10-CM

## 2022-04-14 ENCOUNTER — Other Ambulatory Visit: Payer: Self-pay | Admitting: Adult Health

## 2022-04-14 DIAGNOSIS — E785 Hyperlipidemia, unspecified: Secondary | ICD-10-CM

## 2022-04-14 DIAGNOSIS — I2510 Atherosclerotic heart disease of native coronary artery without angina pectoris: Secondary | ICD-10-CM

## 2022-05-02 IMAGING — CT CT CHEST LUNG CANCER SCREENING LOW DOSE W/O CM
1 of 2 series · 10 of 20 positions shown, 13 images · non-contrast
Comparison: CT lung cancer screening dated February 10, 2020

CLINICAL DATA: Thirty-four pack-year history, current smoker

EXAM:
CT CHEST WITHOUT CONTRAST LOW-DOSE FOR LUNG CANCER SCREENING
TECHNIQUE: Multidetector CT imaging of the chest was performed following the
standard protocol without IV contrast.

[ct lung segmentation data · axial · 0.62mm/px · z∈[-320,-320]mm · 10 of 309 frames shown]
[frame 1/309  mediastinal]
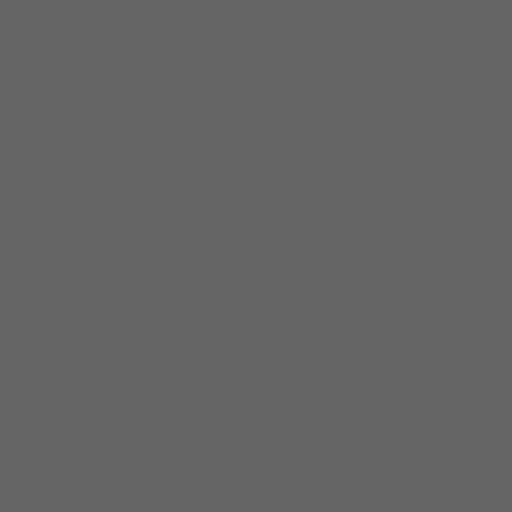
[frame 1/309  lung]
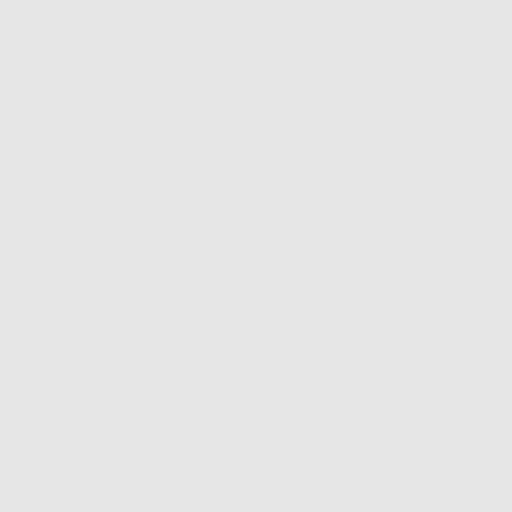
[frame 35/309  lung]
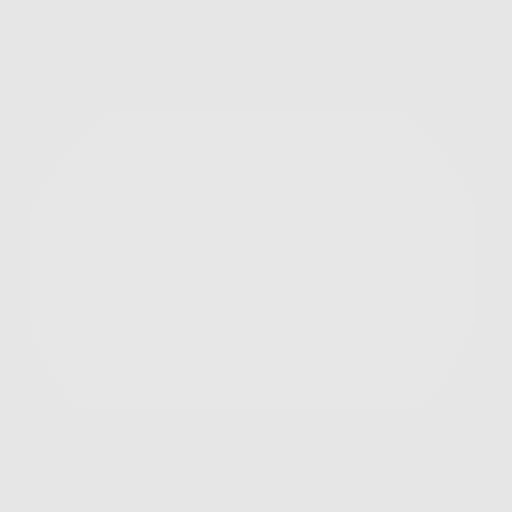
[frame 69/309  lung]
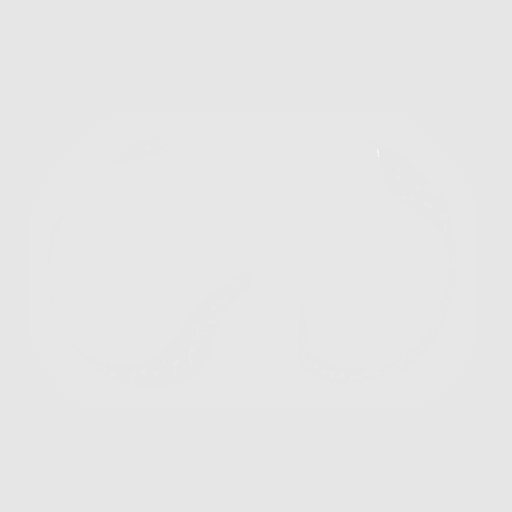
[frame 103/309  lung]
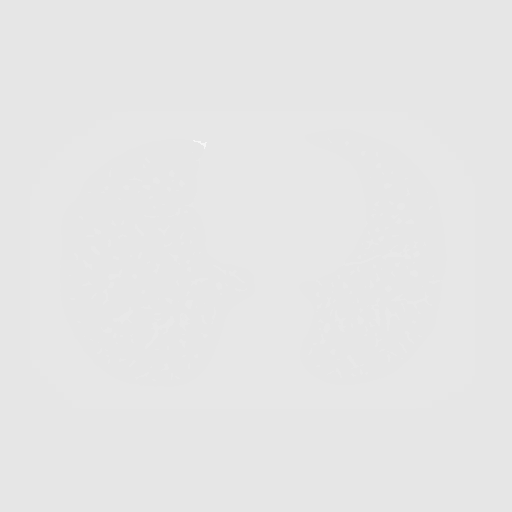
[frame 137/309  mediastinal]
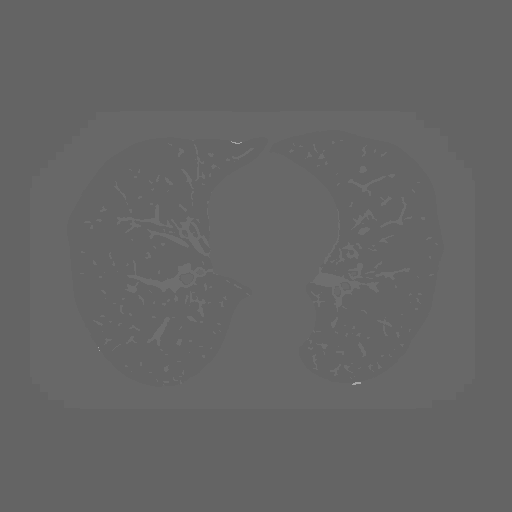
[frame 137/309  lung]
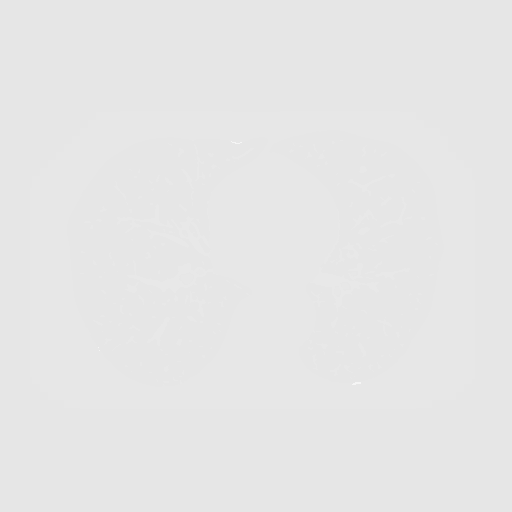
[frame 172/309  lung]
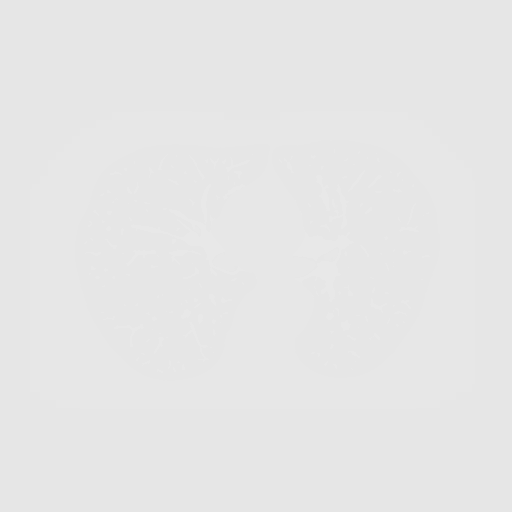
[frame 206/309  lung]
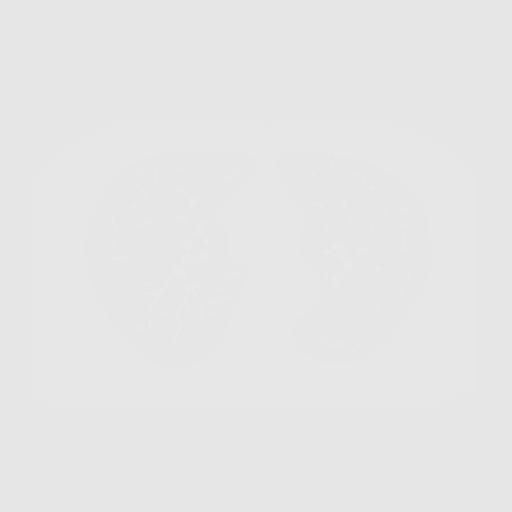
[frame 240/309  lung]
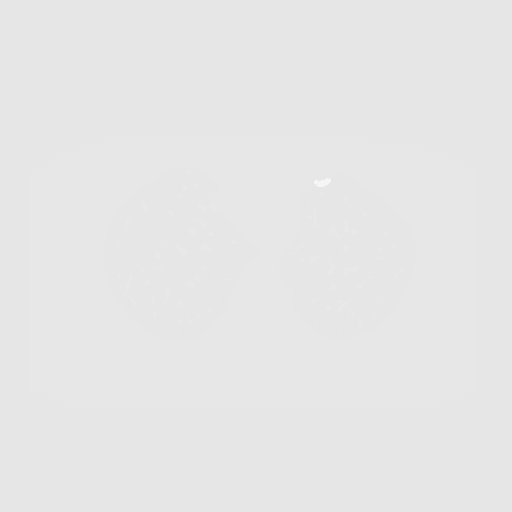
[frame 274/309  mediastinal]
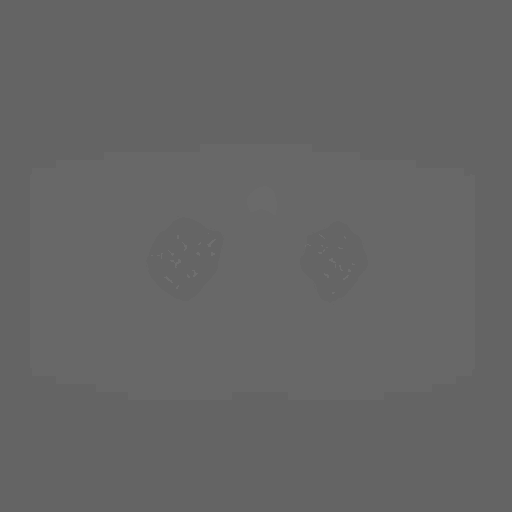
[frame 274/309  lung]
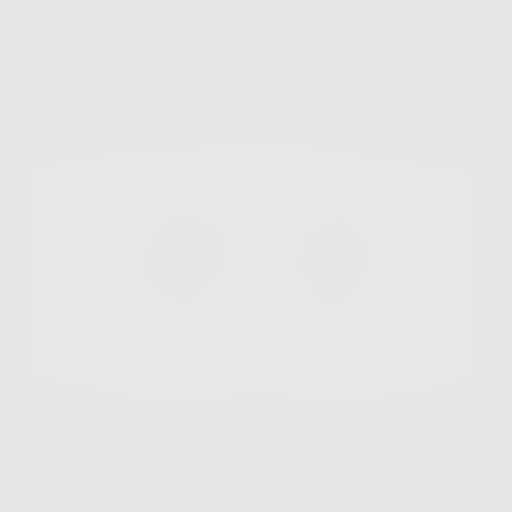
[frame 309/309  lung]
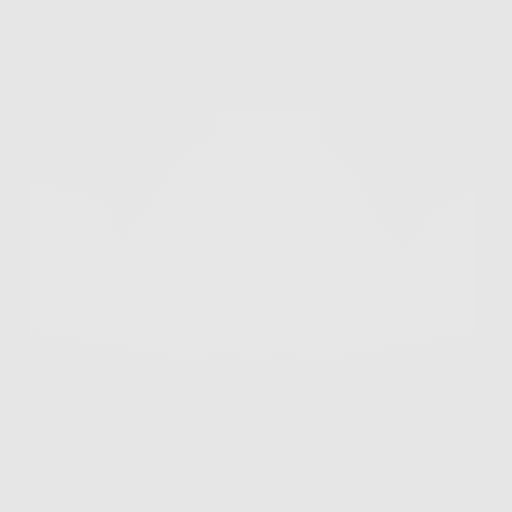

[10 of 20 positions shown; findings below may reference images not displayed]

FINDINGS: Cardiovascular: Normal heart size. Three-vessel coronary artery
calcifications. Atherosclerotic disease of the thoracic aorta.

Mediastinum/Nodes: No pathologically enlarged lymph nodes seen in
the chest. Esophagus and thyroid are unremarkable.

Lungs/Pleura: Central airways are patent. Mild centrilobular
emphysema. Scattered pulmonary nodules. For reference right middle
lobe solid pulmonary nodule measuring 3 mm located on series 3,
image 183.

Upper Abdomen: No acute abnormality.

Musculoskeletal: No chest wall mass or suspicious bone lesions
identified.
IMPRESSION: Lung-RADS 2, benign appearance or behavior. Continue annual
screening with low-dose chest CT without contrast in 12 months.

Aortic Atherosclerosis (2ZTQQ-ZAH.H) and Emphysema (2ZTQQ-QUE.N).

## 2022-10-26 ENCOUNTER — Other Ambulatory Visit: Payer: Self-pay | Admitting: Adult Health

## 2022-10-26 DIAGNOSIS — I2510 Atherosclerotic heart disease of native coronary artery without angina pectoris: Secondary | ICD-10-CM

## 2022-10-26 DIAGNOSIS — E785 Hyperlipidemia, unspecified: Secondary | ICD-10-CM

## 2022-10-29 NOTE — Telephone Encounter (Signed)
Patient need to schedule a CPE ov for more refills. 

## 2022-10-31 NOTE — Telephone Encounter (Signed)
Left message to return phone call.

## 2022-11-01 NOTE — Telephone Encounter (Signed)
Called pt again going to vm. Lm for a return call.

## 2022-11-01 NOTE — Telephone Encounter (Signed)
Pt return your call and stated he want a call back.

## 2022-12-03 ENCOUNTER — Emergency Department
Admission: EM | Admit: 2022-12-03 | Discharge: 2022-12-03 | Disposition: A | Discharge status: Home / Self Care | Payer: Managed Care, Other (non HMO) | Attending: Student in an Organized Health Care Education/Training Program | Admitting: Student in an Organized Health Care Education/Training Program

## 2022-12-03 ENCOUNTER — Other Ambulatory Visit: Payer: Self-pay

## 2022-12-03 ENCOUNTER — Emergency Department: Payer: Managed Care, Other (non HMO)

## 2022-12-03 DIAGNOSIS — N200 Calculus of kidney: Secondary | ICD-10-CM

## 2022-12-03 DIAGNOSIS — M545 Low back pain, unspecified: Secondary | ICD-10-CM | POA: Diagnosis present

## 2022-12-03 DIAGNOSIS — N132 Hydronephrosis with renal and ureteral calculous obstruction: Secondary | ICD-10-CM | POA: Insufficient documentation

## 2022-12-03 LAB — COMPREHENSIVE METABOLIC PANEL
ALT: 19 U/L (ref 0–44)
AST: 26 U/L (ref 15–41)
Albumin: 4.4 g/dL (ref 3.5–5.0)
Alkaline Phosphatase: 82 U/L (ref 38–126)
Anion gap: 10 (ref 5–15)
BUN: 23 mg/dL (ref 8–23)
CO2: 26 mmol/L (ref 22–32)
Calcium: 9.1 mg/dL (ref 8.9–10.3)
Chloride: 102 mmol/L (ref 98–111)
Creatinine, Ser: 1.71 mg/dL — ABNORMAL HIGH (ref 0.61–1.24)
GFR, Estimated: 44 mL/min — ABNORMAL LOW (ref >60)
Glucose, Bld: 108 mg/dL — ABNORMAL HIGH (ref 70–99)
Potassium: 3.8 mmol/L (ref 3.5–5.1)
Sodium: 138 mmol/L (ref 135–145)
Total Bilirubin: 1.2 mg/dL (ref 0.3–1.2)
Total Protein: 7.6 g/dL (ref 6.5–8.1)

## 2022-12-03 LAB — URINALYSIS, ROUTINE W REFLEX MICROSCOPIC
Bilirubin Urine: NEGATIVE
Glucose, UA: NEGATIVE mg/dL
Ketones, ur: NEGATIVE mg/dL
Leukocytes,Ua: NEGATIVE
Nitrite: NEGATIVE
Protein, ur: NEGATIVE mg/dL
RBC / HPF: >50 RBC/hpf (ref 0–5)
Specific Gravity, Urine: 1.017 (ref 1.005–1.030)
pH: 6 (ref 5.0–8.0)

## 2022-12-03 LAB — CBC WITH DIFFERENTIAL/PLATELET
Abs Immature Granulocytes: 0.03 10*3/uL (ref 0.00–0.07)
Basophils Absolute: 0.1 10*3/uL (ref 0.0–0.1)
Basophils Relative: 1 %
Eosinophils Absolute: 0.3 10*3/uL (ref 0.0–0.5)
Eosinophils Relative: 2 %
HCT: 46.8 % (ref 39.0–52.0)
Hemoglobin: 15.2 g/dL (ref 13.0–17.0)
Immature Granulocytes: 0 %
Lymphocytes Relative: 12 %
Lymphs Abs: 1.4 10*3/uL (ref 0.7–4.0)
MCH: 29.6 pg (ref 26.0–34.0)
MCHC: 32.5 g/dL (ref 30.0–36.0)
MCV: 91.1 fL (ref 80.0–100.0)
Monocytes Absolute: 1.1 10*3/uL — ABNORMAL HIGH (ref 0.1–1.0)
Monocytes Relative: 9 %
Neutro Abs: 9.2 10*3/uL — ABNORMAL HIGH (ref 1.7–7.7)
Neutrophils Relative %: 76 %
Platelets: 232 10*3/uL (ref 150–400)
RBC: 5.14 MIL/uL (ref 4.22–5.81)
RDW: 12.5 % (ref 11.5–15.5)
WBC: 12.1 10*3/uL — ABNORMAL HIGH (ref 4.0–10.5)
nRBC: 0 % (ref 0.0–0.2)

## 2022-12-03 MED ORDER — OXYCODONE-ACETAMINOPHEN 5-325 MG PO TABS
1.0000 | ORAL_TABLET | Freq: Once | ORAL | Status: AC
  Administered 2022-12-03: 1 via ORAL
  Filled 2022-12-03: qty 1

## 2022-12-03 MED ORDER — KETOROLAC TROMETHAMINE 10 MG PO TABS: 10.0000 mg | ORAL_TABLET | Freq: Four times a day (QID) | ORAL | 0 refills | Status: DC | PRN

## 2022-12-03 MED ORDER — TAMSULOSIN HCL 0.4 MG PO CAPS: 0.4000 mg | ORAL_CAPSULE | Freq: Every day | ORAL | 0 refills | Status: DC

## 2022-12-03 MED ORDER — TAMSULOSIN HCL 0.4 MG PO CAPS
0.4000 mg | ORAL_CAPSULE | Freq: Once | ORAL | Status: AC
  Administered 2022-12-03: 0.4 mg via ORAL
  Filled 2022-12-03: qty 1

## 2022-12-03 MED ORDER — OXYCODONE-ACETAMINOPHEN 5-325 MG PO TABS: 1.0000 | ORAL_TABLET | Freq: Four times a day (QID) | ORAL | 0 refills | Status: DC | PRN

## 2022-12-03 MED ORDER — KETOROLAC TROMETHAMINE 30 MG/ML IJ SOLN
30.0000 mg | Freq: Once | INTRAMUSCULAR | Status: AC
  Administered 2022-12-03: 30 mg via INTRAMUSCULAR
  Filled 2022-12-03: qty 1

## 2022-12-03 NOTE — ED Provider Notes (Signed)
[facility] Provider Note  Patient Contact: 7:12 PM (approximate)   History   Back Pain   HPI  Kurt Duncan is a 67 y.o. male who presents the emergency department complaining of low back pain worse on the left side.  Patient does have a history of recurrent UTIs ultimately leading to pyelonephritis as well as a history of kidney stones.  However patient also states that he lifted a heavy cooler the day before the onset of his back pain.  He did have some dark urine yesterday but was unable to appreciate frank blood.  He has had no dysuria, polyuria.  No abdominal complaints.  No fevers or chills.  No direct trauma to the spine.  No radicular symptoms.  No bowel or bladder function, saddle anesthesia or paresthesias.     Physical Exam   Triage Vital Signs: ED Triage Vitals [12/03/22 1840]  Enc Vitals Group     BP (!) 161/95     Pulse Rate 73     Resp 18     Temp 98 F (36.7 C)     Temp src      SpO2 99 %     Weight      Height      Head Circumference      Peak Flow      Pain Score 7     Pain Loc      Pain Edu?      Excl. in GC?     Most recent vital signs: Vitals:   12/03/22 1840  BP: (!) 161/95  Pulse: 73  Resp: 18  Temp: 98 F (36.7 C)  SpO2: 99%     General: Alert and in no acute distress.  Cardiovascular:  Good peripheral perfusion Respiratory: Normal respiratory effort without tachypnea or retractions. Lungs CTAB.  Gastrointestinal: Bowel sounds 4 quadrants. Soft and nontender to palpation. No guarding or rigidity. No palpable masses. No distention.  Mild left-sided CVA tenderness but this extends along into the musculature of the left paraspinal muscles as well. Musculoskeletal: Full range of motion to all extremities.  Visualization of the lower spine reveals no visible signs of trauma.  Tenderness in the left paraspinal region extending towards the flank.  No palpable abnormality or step-off.  Pulses sensation intact and  equal bilateral lower extremities. Neurologic:  No gross focal neurologic deficits are appreciated.  Skin:   No rash noted Other:   ED Results / Procedures / Treatments   Labs (all labs ordered are listed, but only abnormal results are displayed) Labs Reviewed  URINALYSIS, ROUTINE W REFLEX MICROSCOPIC - Abnormal; Notable for the following components:      Result Value   Color, Urine YELLOW (*)    APPearance CLEAR (*)    Hgb urine dipstick LARGE (*)    Bacteria, UA RARE (*)    All other components within normal limits  COMPREHENSIVE METABOLIC PANEL - Abnormal; Notable for the following components:   Glucose, Bld 108 (*)    Creatinine, Ser 1.71 (*)    GFR, Estimated 44 (*)    All other components within normal limits  CBC WITH DIFFERENTIAL/PLATELET - Abnormal; Notable for the following components:   WBC 12.1 (*)    Neutro Abs 9.2 (*)    Monocytes Absolute 1.1 (*)    All other components within normal limits     EKG     RADIOLOGY  I personally viewed, evaluated, and interpreted these images as part of my  medical decision making, as well as reviewing the written report by the radiologist.  ED Provider Interpretation: Left-sided kidney stone at the UVJ with hydroureteronephrosis.  No perinephric stranding concerning for pyelonephritis.  CT Renal Stone Study  Result Date: 12/03/2022 CLINICAL DATA:  Abdominal/flank pain EXAM: CT ABDOMEN AND PELVIS WITHOUT CONTRAST TECHNIQUE: Multidetector CT imaging of the abdomen and pelvis was performed following the standard protocol without IV contrast. RADIATION DOSE REDUCTION: This exam was performed according to the departmental dose-optimization program which includes automated exposure control, adjustment of the mA and/or kV according to patient size and/or use of iterative reconstruction technique. COMPARISON:  None Available. FINDINGS: Lower chest: No acute abnormality. Right basilar subsegmental linear atelectasis. Hepatobiliary: No  focal liver abnormality is seen. No gallstones, gallbladder wall thickening, or biliary dilatation. Pancreas: Unremarkable. No pancreatic ductal dilatation or surrounding inflammatory changes. Spleen: Normal in size without focal abnormality. Adrenals/Urinary Tract: Adrenal glands are unremarkable. There are 6 and 3 mm nonobstructing calculi in the interpolar region of the left kidney. Mild left hydroureteronephrosis secondary to a 3 mm calculus in the distal left ureter at the ureterovesical junction. Right kidney and ureter are unremarkable. Bladder is unremarkable. Stomach/Bowel: Stomach is within normal limits. Appendix appears normal. No evidence of bowel wall thickening, distention, or inflammatory changes. Vascular/Lymphatic: Mild aortic atherosclerosis. No enlarged abdominal or pelvic lymph nodes. Reproductive: Prostate is unremarkable. Other: No abdominal wall hernia or abnormality. No abdominopelvic ascites. Musculoskeletal: No acute or significant osseous findings. IMPRESSION: 1. Mild left hydroureteronephrosis secondary to a 3 mm calculus in the distal left ureter at the ureterovesical junction. 2. There are 6 and 3 mm non obstructing calculi in the interpolar region of the left kidney. 3. Right kidney and ureter are unremarkable. 4. Bowel loops are normal in caliber. Normal appendix. 5. Aortic atherosclerosis. Electronically Signed   By: Larose Hires D.O.   On: 12/03/2022 19:49    PROCEDURES:  Critical Care performed: No  Procedures   MEDICATIONS ORDERED IN ED: Medications  oxyCODONE-acetaminophen (PERCOCET/ROXICET) 5-325 MG per tablet 1 tablet (1 tablet Oral Given 12/03/22 2044)  ketorolac (TORADOL) 30 MG/ML injection 30 mg (30 mg Intramuscular Given 12/03/22 2042)  tamsulosin (FLOMAX) capsule 0.4 mg (0.4 mg Oral Given 12/03/22 2044)     IMPRESSION / MDM / ASSESSMENT AND PLAN / ED COURSE  I reviewed the triage vital signs and the nursing notes.                                  Differential diagnosis includes, but is not limited to, kidney stone, UTI, pyelonephritis, low back strain   Patient's presentation is most consistent with acute presentation with potential threat to life or bodily function.   Patient's diagnosis is consistent with kidney stone.  Patient presents emergency department with low back pain.  Has a history of UTIs, pyonephritis and nephrolithiasis.  Patient also had been lifting some heavy items prior to the onset of symptoms.  Patient was afebrile, urinalysis with blood but no evidence of infection.  CT scan was obtained which revealed evidence of a 3 mm stone in the left distal ureter at the UVJ.  There is some mild hydroureteronephrosis.  At this time no evidence of infectious component.  This time patient will be treated with Toradol, Flomax and pain medication.  Follow-up with urology if symptoms or not improving.  Return precautions discussed with the patient..  Patient is given ED precautions to  return to the ED for any worsening or new symptoms.     FINAL CLINICAL IMPRESSION(S) / ED DIAGNOSES   Final diagnoses:  Left nephrolithiasis     Rx / DC Orders   ED Discharge Orders          Ordered    ketorolac (TORADOL) 10 MG tablet  Every 6 hours PRN        12/03/22 2043    tamsulosin (FLOMAX) 0.4 MG CAPS capsule  Daily        12/03/22 2043    oxyCODONE-acetaminophen (PERCOCET/ROXICET) 5-325 MG tablet  Every 6 hours PRN        12/03/22 2043             Note:  This document was prepared using Dragon voice recognition software and may include unintentional dictation errors.   Lanette Hampshire 12/03/22 2045    Willy Eddy, MD 12/05/22 [phone]

## 2022-12-03 NOTE — ED Triage Notes (Signed)
Pt comes with c/o lower left sided back pain. Pt states this started couple days ago. Pt states he did do some heavy lifting.

## 2022-12-17 ENCOUNTER — Other Ambulatory Visit: Payer: Self-pay | Admitting: Acute Care

## 2022-12-17 DIAGNOSIS — Z122 Encounter for screening for malignant neoplasm of respiratory organs: Secondary | ICD-10-CM

## 2022-12-17 DIAGNOSIS — F17210 Nicotine dependence, cigarettes, uncomplicated: Secondary | ICD-10-CM

## 2022-12-17 DIAGNOSIS — Z87891 Personal history of nicotine dependence: Secondary | ICD-10-CM

## 2023-01-08 ENCOUNTER — Encounter: Payer: Self-pay | Admitting: Adult Health

## 2023-01-08 ENCOUNTER — Ambulatory Visit (INDEPENDENT_AMBULATORY_CARE_PROVIDER_SITE_OTHER): Payer: Managed Care, Other (non HMO) | Admitting: Adult Health

## 2023-01-08 VITALS — BP 120/88 | HR 76 | Temp 97.8°F | Ht 64.0 in | Wt 117.0 lb

## 2023-01-08 DIAGNOSIS — Z125 Encounter for screening for malignant neoplasm of prostate: Secondary | ICD-10-CM

## 2023-01-08 DIAGNOSIS — Z0000 Encounter for general adult medical examination without abnormal findings: Secondary | ICD-10-CM

## 2023-01-08 DIAGNOSIS — F17200 Nicotine dependence, unspecified, uncomplicated: Secondary | ICD-10-CM

## 2023-01-08 DIAGNOSIS — N1832 Chronic kidney disease, stage 3b: Secondary | ICD-10-CM

## 2023-01-08 DIAGNOSIS — Z23 Encounter for immunization: Secondary | ICD-10-CM | POA: Diagnosis not present

## 2023-01-08 DIAGNOSIS — E785 Hyperlipidemia, unspecified: Secondary | ICD-10-CM

## 2023-01-08 DIAGNOSIS — I2510 Atherosclerotic heart disease of native coronary artery without angina pectoris: Secondary | ICD-10-CM | POA: Diagnosis not present

## 2023-01-08 DIAGNOSIS — N200 Calculus of kidney: Secondary | ICD-10-CM

## 2023-01-08 LAB — COMPREHENSIVE METABOLIC PANEL
ALT: 27 U/L (ref 0–53)
AST: 23 U/L (ref 0–37)
Albumin: 4.4 g/dL (ref 3.5–5.2)
Alkaline Phosphatase: 75 U/L (ref 39–117)
BUN: 25 mg/dL — ABNORMAL HIGH (ref 6–23)
CO2: 30 mEq/L (ref 19–32)
Calcium: 9.8 mg/dL (ref 8.4–10.5)
Chloride: 103 mEq/L (ref 96–112)
Creatinine, Ser: 1.4 mg/dL (ref 0.40–1.50)
GFR: 52.64 mL/min — ABNORMAL LOW (ref >60.00)
Glucose, Bld: 90 mg/dL (ref 70–99)
Potassium: 4.4 mEq/L (ref 3.5–5.1)
Sodium: 140 mEq/L (ref 135–145)
Total Bilirubin: 0.9 mg/dL (ref 0.2–1.2)
Total Protein: 7 g/dL (ref 6.0–8.3)

## 2023-01-08 LAB — CBC
HCT: 48.2 % (ref 39.0–52.0)
Hemoglobin: 15.9 g/dL (ref 13.0–17.0)
MCHC: 33 g/dL (ref 30.0–36.0)
MCV: 91.4 fl (ref 78.0–100.0)
Platelets: 187 10*3/uL (ref 150.0–400.0)
RBC: 5.27 Mil/uL (ref 4.22–5.81)
RDW: 13 % (ref 11.5–15.5)
WBC: 6.1 10*3/uL (ref 4.0–10.5)

## 2023-01-08 LAB — PSA: PSA: 0.78 ng/mL (ref 0.10–4.00)

## 2023-01-08 LAB — LIPID PANEL
Cholesterol: 156 mg/dL (ref 0–200)
HDL: 54.3 mg/dL (ref >39.00)
LDL Cholesterol: 87 mg/dL (ref 0–99)
NonHDL: 102.16
Total CHOL/HDL Ratio: 3
Triglycerides: 77 mg/dL (ref 0.0–149.0)
VLDL: 15.4 mg/dL (ref 0.0–40.0)

## 2023-01-08 NOTE — Patient Instructions (Addendum)
It was great seeing you today   We will follow up with you regarding your lab work   Please let me know if you need anything   I will see you back in one year or sooner if needed  Please work on quitting smoking

## 2023-01-08 NOTE — Progress Notes (Signed)
Subjective:    Patient ID: Kurt Duncan, male    DOB: May 09, 1956, 67 y.o.   MRN: [id number]  HPI  Patient presents for yearly preventative medicine examination. He is a pleasant 67 year old male who  has a past medical history of Coronary artery calcification seen on CT scan (01/2019), Current smoker, History of pyelonephritis, and Hyperlipidemia.  Hyperlipidemia/CAD -currently prescribed Lipitor 40 mg daily and ASA 180 mg every other day.  He is followed by cardiology Today's symptoms.  Denies chest pain, DOE, palpitations, sinus, or lightheadedness Lab Results  Component Value Date   CHOL 130 09/26/2021   HDL 49.40 09/26/2021   LDLCALC 61 09/26/2021   LDLDIRECT 151.3 08/02/2013   TRIG 101.0 09/26/2021   CHOLHDL 3 09/26/2021   CKD stage III-followed by nephrology on a routine basis.  He was last seen in August 2023 at which time all labs are stable.  Tobacco use-continues to smoke half a pack a day.  Has not really motivated to quit smoking. He has routine CT Chest lung cancer screening done.   Nephrilithiasis - was seen in the ER last month and diagnosed with kidney stones. He did pass a 3 mm stone a day after going to the ER. Has not passed any since. Not having any pain. He brought the stone with him today.   His CT showed   IMPRESSION: 1. Mild left hydroureteronephrosis secondary to a 3 mm calculus in the distal left ureter at the ureterovesical junction. 2. There are 6 and 3 mm non obstructing calculi in the interpolar region of the left kidney. 3. Right kidney and ureter are unremarkable. 4. Bowel loops are normal in caliber. Normal appendix. 5. Aortic atherosclerosis.   All immunizations and health maintenance protocols were reviewed with the patient and needed orders were placed.  Appropriate screening laboratory values were ordered for the patient including screening of hyperlipidemia, renal function and hepatic function. If indicated by BPH, a PSA was  ordered.  Medication reconciliation,  past medical history, social history, problem list and allergies were reviewed in detail with the patient  Goals were established with regard to weight loss, exercise, and  diet in compliance with medications Wt Readings from Last 3 Encounters:  01/08/23 117 lb (53.1 kg)  09/26/21 120 lb (54.4 kg)  05/29/21 124 lb 12.8 oz (56.6 kg)   Review of Systems  Constitutional: Negative.   HENT: Negative.    Eyes: Negative.   Respiratory: Negative.    Cardiovascular: Negative.   Gastrointestinal: Negative.   Endocrine: Negative.   Genitourinary: Negative.   Musculoskeletal: Negative.   Skin: Negative.   Allergic/Immunologic: Negative.   Neurological: Negative.   Hematological: Negative.   Psychiatric/Behavioral: Negative.    All other systems reviewed and are negative.  Past Medical History:  Diagnosis Date   Coronary artery calcification seen on CT scan 01/2019   Coronary calcium score 73   Current smoker    One half PPD x50 years   History of pyelonephritis    Hyperlipidemia     Social History   Socioeconomic History   Marital status: Married    Spouse name: Not on file   Number of children: 3   Years of education: Not on file   Highest education level: Bachelor's degree (e.g., BA, AB, BS)  Occupational History   Occupation: Comptroller    Employer: Hospital doctor  Tobacco Use   Smoking status: Every Day    Packs/day: 0.50    Years: 48.00  Additional pack years: 0.00    Total pack years: 24.00    Types: Cigarettes   Smokeless tobacco: Current   Tobacco comments:    Encouaged patient to quit. 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, international aid and watch TV   Married 30 years   3 daughters ages 57, 23, 72   Social Determinants of Health   Physicist, medical Strain: Low Risk  (05/28/2021)   Overall Financial Resource Strain (CARDIA)    Difficulty of Paying Living Expenses: Not hard at all  Food Insecurity: No Food Insecurity (05/28/2021)   Hunger Vital Sign    Worried About Running Out of Food in the Last Year: Never true    Ran Out of Food in the Last Year: Never true  Transportation Needs: No Transportation Needs (05/28/2021)   PRAPARE - Administrator, Civil Service (Medical): No    Lack of Transportation (Non-Medical): No  Physical Activity: Insufficiently Active (05/28/2021)   Exercise Vital Sign    Days of Exercise per Week: 1 day    Minutes of Exercise per Session: 10 min  Stress: No Stress Concern Present (05/28/2021)   [employer] of Occupational Health - Occupational Stress Questionnaire    Feeling of Stress : Not at all  Social Connections: Moderately Integrated (05/28/2021)   Social Connection and Isolation Panel [NHANES]    Frequency of Communication with Friends and Family: Twice a week    Frequency of Social Gatherings with Friends and Family: Twice a week    Attends Religious Services: More than 4 times per year    Active Member of [employer] or Organizations: No    Attends Engineer, structural: Not on file    Marital Status: Married  Catering manager Violence: Not on file    Past Surgical History:  Procedure Laterality Date   COLONOSCOPY  2012   Normal    Family History  Problem Relation Age of Onset   Hypertension Mother    Cancer Father 45       brain    No Known Allergies  Current Outpatient Medications on File Prior to Visit  Medication Sig Dispense Refill   aspirin EC 325 MG tablet Take 1/2 tablet of 325 mg every other day 30 tablet 0   atorvastatin (LIPITOR) 40 MG tablet TAKE 1 TABLET BY MOUTH EVERY DAY 90 tablet 1   Multiple  Vitamin (MULTIVITAMIN) tablet Take 1 tablet by mouth daily.     No current facility-administered medications on file prior to visit.    BP 120/88   Pulse 76   Temp 97.8 F (36.6 C) (Oral)   Ht 5\' 4"  (1.626 m)   Wt 117 lb (53.1 kg)   SpO2 97%   BMI 20.08 kg/m       Objective:   Physical Exam Vitals and nursing note reviewed.  Constitutional:      General: He is not in acute distress.    Appearance: Normal appearance. He is not ill-appearing.  HENT:     Head: Normocephalic and atraumatic.     Right Ear: Tympanic membrane, ear canal  and external ear normal. There is no impacted cerumen.     Left Ear: Tympanic membrane, ear canal and external ear normal. There is no impacted cerumen.     Nose: Nose normal. No congestion or rhinorrhea.     Mouth/Throat:     Mouth: Mucous membranes are moist.     Pharynx: Oropharynx is clear.  Eyes:     Extraocular Movements: Extraocular movements intact.     Conjunctiva/sclera: Conjunctivae normal.     Pupils: Pupils are equal, round, and reactive to light.  Neck:     Vascular: No carotid bruit.  Cardiovascular:     Rate and Rhythm: Normal rate and regular rhythm.     Pulses: Normal pulses.     Heart sounds: No murmur heard.    No friction rub. No gallop.  Pulmonary:     Effort: Pulmonary effort is normal.     Breath sounds: Normal breath sounds.  Abdominal:     General: Abdomen is flat. Bowel sounds are normal. There is no distension.     Palpations: Abdomen is soft. There is no mass.     Tenderness: There is no abdominal tenderness. There is no guarding or rebound.     Hernia: No hernia is present.  Musculoskeletal:        General: Normal range of motion.     Cervical back: Normal range of motion and neck supple.  Lymphadenopathy:     Cervical: No cervical adenopathy.  Skin:    General: Skin is warm and dry.     Capillary Refill: Capillary refill takes less than 2 seconds.  Neurological:     General: No focal deficit present.      Mental Status: He is alert and oriented to person, place, and time.  Psychiatric:        Mood and Affect: Mood normal.        Behavior: Behavior normal.        Thought Content: Thought content normal.        Judgment: Judgment normal.       Assessment & Plan:  1. Routine general medical examination at a health care facility Today patient counseled on age appropriate routine health concerns for screening and prevention, each reviewed and up to date or declined. Immunizations reviewed and up to date or declined. Labs ordered and reviewed. Risk factors for depression reviewed and negative. Hearing function and visual acuity are intact. ADLs screened and addressed as needed. Functional ability and level of safety reviewed and appropriate. Education, counseling and referrals performed based on assessed risks today. Patient provided with a copy of personalized plan for preventive services. - Pneumonia vaccination given   2. Hyperlipidemia, unspecified hyperlipidemia type - Continue statin  - Lipid panel; Future - CBC; Future - Comprehensive metabolic panel; Future  3. Tobacco use disorder - Encouraged to quit smoking  - Continue with routine screening   4. Prostate cancer screening  - PSA; Future  5. Coronary artery disease involving native heart without angina pectoris, unspecified vessel or lesion type - Continue statin and ASA 325 mg  - Follow up with Cardioogy  - Lipid panel; Future - CBC; Future - Comprehensive metabolic panel; Future  6. Stage 3b chronic kidney disease (HCC) - Per nephrology  - Lipid panel; Future - CBC; Future - Comprehensive metabolic panel; Future  7. Kidney stones  - Stone analysis; Future   Shirline Frees, NP

## 2023-01-15 LAB — STONE ANALYSIS: Stone Weight: 0.017 g

## 2023-02-17 ENCOUNTER — Ambulatory Visit
Admission: RE | Admit: 2023-02-17 | Discharge: 2023-02-17 | Disposition: A | Discharge status: Home / Self Care | Payer: Managed Care, Other (non HMO) | Source: Ambulatory Visit | Attending: Adult Health | Admitting: Adult Health

## 2023-02-17 DIAGNOSIS — Z122 Encounter for screening for malignant neoplasm of respiratory organs: Secondary | ICD-10-CM

## 2023-02-17 DIAGNOSIS — F17210 Nicotine dependence, cigarettes, uncomplicated: Secondary | ICD-10-CM

## 2023-02-17 DIAGNOSIS — Z87891 Personal history of nicotine dependence: Secondary | ICD-10-CM

## 2023-02-24 ENCOUNTER — Other Ambulatory Visit: Payer: Self-pay | Admitting: Acute Care

## 2023-02-24 DIAGNOSIS — Z87891 Personal history of nicotine dependence: Secondary | ICD-10-CM

## 2023-02-24 DIAGNOSIS — F17210 Nicotine dependence, cigarettes, uncomplicated: Secondary | ICD-10-CM

## 2023-02-24 DIAGNOSIS — Z122 Encounter for screening for malignant neoplasm of respiratory organs: Secondary | ICD-10-CM

## 2023-03-21 ENCOUNTER — Other Ambulatory Visit: Payer: Self-pay | Admitting: Nephrology

## 2023-03-21 DIAGNOSIS — N200 Calculus of kidney: Secondary | ICD-10-CM

## 2023-03-21 DIAGNOSIS — N1831 Chronic kidney disease, stage 3a: Secondary | ICD-10-CM

## 2023-03-21 DIAGNOSIS — N1330 Unspecified hydronephrosis: Secondary | ICD-10-CM

## 2023-03-21 LAB — LAB REPORT - SCANNED
Creatinine, POC: 209.8 mg/dL
EGFR: 58

## 2023-03-27 ENCOUNTER — Ambulatory Visit
Admission: RE | Admit: 2023-03-27 | Discharge: 2023-03-27 | Disposition: A | Discharge status: Home / Self Care | Payer: Managed Care, Other (non HMO) | Source: Ambulatory Visit | Attending: Nephrology | Admitting: Nephrology

## 2023-03-27 DIAGNOSIS — N1831 Chronic kidney disease, stage 3a: Secondary | ICD-10-CM

## 2023-03-27 DIAGNOSIS — N200 Calculus of kidney: Secondary | ICD-10-CM

## 2023-03-27 DIAGNOSIS — N1330 Unspecified hydronephrosis: Secondary | ICD-10-CM

## 2023-06-18 ENCOUNTER — Telehealth: Payer: Self-pay

## 2023-06-18 DIAGNOSIS — I2510 Atherosclerotic heart disease of native coronary artery without angina pectoris: Secondary | ICD-10-CM

## 2023-06-18 DIAGNOSIS — E785 Hyperlipidemia, unspecified: Secondary | ICD-10-CM

## 2023-06-18 MED ORDER — ATORVASTATIN CALCIUM 40 MG PO TABS
40.0000 mg | ORAL_TABLET | Freq: Every day | ORAL | 1 refills | Status: DC
Start: 2023-06-18 — End: 2023-10-29

## 2023-06-18 NOTE — Addendum Note (Signed)
Addended by: Waymon Amato R on: 06/18/2023 10:35 AM   Modules accepted: Orders

## 2023-06-18 NOTE — Telephone Encounter (Signed)
Copied from CRM [id number]. Topic: Clinical - Medication Refill >> Jun 18, 2023  9:45 AM Cassiday T wrote: Most Recent Primary Care Visit:  Provider: Shirline Frees  Department: [facility]  Visit Type: PHYSICAL  Date: 01/08/2023  Medication: atorvastatin (LIPITOR) 40 MG tablet  Has the patient contacted their pharmacy? Yes (Agent: If no, request that the patient contact the pharmacy for the refill. If patient does not wish to contact the pharmacy document the reason why and proceed with request.) (Agent: If yes, when and what did the pharmacy advise?)  Is this the correct pharmacy for this prescription? Yes If no, delete pharmacy and type the correct one.  This is the patient's preferred pharmacy:   [facility] - [place], [place] - [address] [place] [address] Phone: [phone] Fax: [phone]   Has the prescription been filled recently? Yes  Is the patient out of the medication? Yes  Has the patient been seen for an appointment in the last year OR does the patient have an upcoming appointment? Yes  Can we respond through MyChart? No  Agent: Please be advised that Rx refills may take up to 3 business days. We ask that you follow-up with your pharmacy.

## 2023-06-18 NOTE — Telephone Encounter (Signed)
Rx refilled.

## 2023-10-29 ENCOUNTER — Other Ambulatory Visit: Payer: Self-pay | Admitting: Adult Health

## 2023-10-29 DIAGNOSIS — E785 Hyperlipidemia, unspecified: Secondary | ICD-10-CM

## 2023-10-29 DIAGNOSIS — I2510 Atherosclerotic heart disease of native coronary artery without angina pectoris: Secondary | ICD-10-CM

## 2024-02-18 ENCOUNTER — Other Ambulatory Visit: Payer: Self-pay | Admitting: Acute Care

## 2024-02-18 DIAGNOSIS — Z87891 Personal history of nicotine dependence: Secondary | ICD-10-CM

## 2024-02-18 DIAGNOSIS — F17210 Nicotine dependence, cigarettes, uncomplicated: Secondary | ICD-10-CM

## 2024-02-18 DIAGNOSIS — Z122 Encounter for screening for malignant neoplasm of respiratory organs: Secondary | ICD-10-CM

## 2024-03-02 ENCOUNTER — Ambulatory Visit
Admission: RE | Admit: 2024-03-02 | Discharge: 2024-03-02 | Disposition: A | Discharge status: Home / Self Care | Source: Ambulatory Visit | Attending: Adult Health | Admitting: Adult Health

## 2024-03-02 DIAGNOSIS — Z87891 Personal history of nicotine dependence: Secondary | ICD-10-CM

## 2024-03-02 DIAGNOSIS — F17210 Nicotine dependence, cigarettes, uncomplicated: Secondary | ICD-10-CM

## 2024-03-02 DIAGNOSIS — Z122 Encounter for screening for malignant neoplasm of respiratory organs: Secondary | ICD-10-CM

## 2024-03-10 ENCOUNTER — Other Ambulatory Visit: Payer: Self-pay

## 2024-03-10 DIAGNOSIS — F17210 Nicotine dependence, cigarettes, uncomplicated: Secondary | ICD-10-CM

## 2024-03-10 DIAGNOSIS — Z87891 Personal history of nicotine dependence: Secondary | ICD-10-CM

## 2024-03-10 DIAGNOSIS — Z122 Encounter for screening for malignant neoplasm of respiratory organs: Secondary | ICD-10-CM

## 2024-05-08 ENCOUNTER — Other Ambulatory Visit: Payer: Self-pay | Admitting: Adult Health

## 2024-05-08 DIAGNOSIS — I2510 Atherosclerotic heart disease of native coronary artery without angina pectoris: Secondary | ICD-10-CM

## 2024-05-08 DIAGNOSIS — E785 Hyperlipidemia, unspecified: Secondary | ICD-10-CM

## 2024-07-28 ENCOUNTER — Encounter: Payer: Self-pay | Admitting: Cardiology

## 2024-07-28 ENCOUNTER — Ambulatory Visit: Attending: Cardiology | Admitting: Cardiology

## 2024-07-28 VITALS — BP 120/72 | HR 90 | Ht 64.0 in | Wt 123.4 lb

## 2024-07-28 DIAGNOSIS — E785 Hyperlipidemia, unspecified: Secondary | ICD-10-CM | POA: Diagnosis not present

## 2024-07-28 DIAGNOSIS — F17200 Nicotine dependence, unspecified, uncomplicated: Secondary | ICD-10-CM

## 2024-07-28 DIAGNOSIS — I2510 Atherosclerotic heart disease of native coronary artery without angina pectoris: Secondary | ICD-10-CM

## 2024-07-28 DIAGNOSIS — I10 Essential (primary) hypertension: Secondary | ICD-10-CM

## 2024-07-28 NOTE — Patient Instructions (Addendum)
 Medication Instructions:   No Changes  *If you need a refill on your cardiac medications before your next appointment, please call your pharmacy*   Lab Work: Not needed    Testing/Procedures: Not needed   Follow-Up: At [facility], you and your health needs are our priority.  As part of our continuing mission to provide you with exceptional heart care, we have created designated Provider Care Teams.  These Care Teams include your primary Cardiologist (physician) and Advanced Practice Providers (APPs -  Physician Assistants and Nurse Practitioners) who all work together to provide you with the care you need, when you need it.     Your next appointment:   12 month(s)  The format for your next appointment:   In Person  Provider:   Lum Louis, NP, Rollo Louder, PA-C, or Damien Braver, NP    Other instruction Pleas make and appointment with  Your primary provider

## 2024-08-01 ENCOUNTER — Encounter: Payer: Self-pay | Admitting: Cardiology

## 2024-08-01 NOTE — Assessment & Plan Note (Addendum)
 Continues to smoke approximately half a pack per day. Smoking increases cardiovascular and kidney disease risk. Previous nephrologist recommended cessation. - Encouraged smoking cessation. - Recommended contacting 1-800-QUIT-NOW for smoking cessation support.  4 minutes

## 2024-08-01 NOTE — Assessment & Plan Note (Signed)
 No current symptoms. Coronary artery calcium  score indicates plaque.  Smoking and kidney disease increase cardiovascular risk.  Aspirin  regimen provides protection. - Continue aspirin  regimen (equivalent to 81 mg daily). - Continued lipid management with atorvastatin  40 mL daily - Encouraged smoking cessation to reduce cardiovascular risk. Monitor blood pressure.  Increase exercise

## 2024-08-01 NOTE — Assessment & Plan Note (Signed)
 LDL cholesterol was 87 mg/dL in July 7975.  With smoking history, Target LDL is less than 70 mg/dL due to cardiovascular risk factors.  Smoking and kidney disease necessitate aggressive lipid management. - Continue atorvastatin  40 mg daily and schedule cholesterol check with primary care physician.
